# Patient Record
Sex: Male | Born: 1950
Health system: Southern US, Community
[De-identification: ages and names within clinical notes are randomized; demographics above are authoritative.]

## PROBLEM LIST (undated history)

## (undated) DIAGNOSIS — C801 Malignant (primary) neoplasm, unspecified: Secondary | ICD-10-CM

## (undated) DIAGNOSIS — D649 Anemia, unspecified: Secondary | ICD-10-CM

## (undated) DIAGNOSIS — Z87448 Personal history of other diseases of urinary system: Secondary | ICD-10-CM

## (undated) DIAGNOSIS — E119 Type 2 diabetes mellitus without complications: Secondary | ICD-10-CM

## (undated) DIAGNOSIS — K219 Gastro-esophageal reflux disease without esophagitis: Secondary | ICD-10-CM

## (undated) DIAGNOSIS — I10 Essential (primary) hypertension: Secondary | ICD-10-CM

## (undated) DIAGNOSIS — M1990 Unspecified osteoarthritis, unspecified site: Secondary | ICD-10-CM

## (undated) DIAGNOSIS — M109 Gout, unspecified: Secondary | ICD-10-CM

## (undated) DIAGNOSIS — N289 Disorder of kidney and ureter, unspecified: Secondary | ICD-10-CM

## (undated) HISTORY — DX: Anemia, unspecified: D64.9

## (undated) HISTORY — DX: Personal history of other diseases of urinary system: Z87.448

## (undated) HISTORY — DX: Gout, unspecified: M10.9

## (undated) HISTORY — PX: UPPER GI ENDOSCOPY: SHX6162

## (undated) HISTORY — DX: Type 2 diabetes mellitus without complications: E11.9

## (undated) HISTORY — PX: APPENDECTOMY: SHX54

## (undated) HISTORY — DX: Essential (primary) hypertension: I10

## (undated) HISTORY — DX: Gastro-esophageal reflux disease without esophagitis: K21.9

---

## 2006-08-07 ENCOUNTER — Ambulatory Visit: Payer: Self-pay | Admitting: Urology

## 2011-07-10 ENCOUNTER — Ambulatory Visit: Payer: Self-pay | Admitting: Internal Medicine

## 2013-03-26 DIAGNOSIS — I10 Essential (primary) hypertension: Secondary | ICD-10-CM

## 2013-03-26 DIAGNOSIS — E119 Type 2 diabetes mellitus without complications: Secondary | ICD-10-CM | POA: Insufficient documentation

## 2013-03-26 DIAGNOSIS — R066 Hiccough: Secondary | ICD-10-CM | POA: Insufficient documentation

## 2013-03-26 DIAGNOSIS — K219 Gastro-esophageal reflux disease without esophagitis: Secondary | ICD-10-CM

## 2013-03-26 HISTORY — DX: Essential (primary) hypertension: I10

## 2013-03-26 HISTORY — DX: Gastro-esophageal reflux disease without esophagitis: K21.9

## 2013-03-26 HISTORY — DX: Type 2 diabetes mellitus without complications: E11.9

## 2015-09-01 DIAGNOSIS — R7309 Other abnormal glucose: Secondary | ICD-10-CM | POA: Diagnosis not present

## 2015-09-01 DIAGNOSIS — R066 Hiccough: Secondary | ICD-10-CM | POA: Diagnosis not present

## 2015-09-01 DIAGNOSIS — E119 Type 2 diabetes mellitus without complications: Secondary | ICD-10-CM | POA: Diagnosis not present

## 2015-11-25 DIAGNOSIS — I10 Essential (primary) hypertension: Secondary | ICD-10-CM | POA: Diagnosis not present

## 2015-11-25 DIAGNOSIS — K3189 Other diseases of stomach and duodenum: Secondary | ICD-10-CM | POA: Diagnosis not present

## 2015-11-25 DIAGNOSIS — Z7984 Long term (current) use of oral hypoglycemic drugs: Secondary | ICD-10-CM | POA: Diagnosis not present

## 2015-11-25 DIAGNOSIS — Z881 Allergy status to other antibiotic agents status: Secondary | ICD-10-CM | POA: Diagnosis not present

## 2015-11-25 DIAGNOSIS — Z79899 Other long term (current) drug therapy: Secondary | ICD-10-CM | POA: Diagnosis not present

## 2015-11-25 DIAGNOSIS — K219 Gastro-esophageal reflux disease without esophagitis: Secondary | ICD-10-CM | POA: Diagnosis not present

## 2015-11-25 DIAGNOSIS — Z7982 Long term (current) use of aspirin: Secondary | ICD-10-CM | POA: Diagnosis not present

## 2015-11-25 DIAGNOSIS — M4682 Other specified inflammatory spondylopathies, cervical region: Secondary | ICD-10-CM | POA: Diagnosis not present

## 2015-11-25 DIAGNOSIS — E785 Hyperlipidemia, unspecified: Secondary | ICD-10-CM | POA: Diagnosis not present

## 2015-11-25 DIAGNOSIS — K2950 Unspecified chronic gastritis without bleeding: Secondary | ICD-10-CM | POA: Diagnosis not present

## 2015-11-25 DIAGNOSIS — K449 Diaphragmatic hernia without obstruction or gangrene: Secondary | ICD-10-CM | POA: Diagnosis not present

## 2015-11-25 DIAGNOSIS — E119 Type 2 diabetes mellitus without complications: Secondary | ICD-10-CM | POA: Diagnosis not present

## 2015-11-25 DIAGNOSIS — Z23 Encounter for immunization: Secondary | ICD-10-CM | POA: Diagnosis not present

## 2016-01-13 DIAGNOSIS — M19011 Primary osteoarthritis, right shoulder: Secondary | ICD-10-CM | POA: Diagnosis not present

## 2016-05-12 DIAGNOSIS — Z87448 Personal history of other diseases of urinary system: Secondary | ICD-10-CM | POA: Diagnosis not present

## 2016-05-12 DIAGNOSIS — E119 Type 2 diabetes mellitus without complications: Secondary | ICD-10-CM | POA: Diagnosis not present

## 2016-05-12 DIAGNOSIS — Z1159 Encounter for screening for other viral diseases: Secondary | ICD-10-CM | POA: Diagnosis not present

## 2016-06-13 ENCOUNTER — Encounter: Payer: Self-pay | Admitting: Urology

## 2016-06-13 ENCOUNTER — Ambulatory Visit: Payer: Medicare HMO | Admitting: Urology

## 2016-06-13 VITALS — BP 148/75 | HR 83 | Ht 63.0 in | Wt 116.0 lb

## 2016-06-13 DIAGNOSIS — R3129 Other microscopic hematuria: Secondary | ICD-10-CM | POA: Diagnosis not present

## 2016-06-13 NOTE — Progress Notes (Signed)
06/13/2016 11:03 AM   Ricky Jarvis [birth year] [id number]  Referring provider: Theotis Burrow, MD [address] [place], [place] [address]  Chief Complaint  Patient presents with  . Hematuria    New Patient    HPI: 66 year old male seen today for microscopic hematuria. The patient has been seen and evaluated for this in 2008, evidently this workup was unremarkable. The patient states that he does get up to 3 times a night, this is normal for him. He describes a strong stream, has no history of recurrent urinary tract infections or prostate infections. He has no history of kidney stones. He is no smoking history. The patient feels that he empties his bladder fairly well. The patient has not had a PSA recently.     PMH: Past Medical History:  Diagnosis Date  . Diabetes ([place]) 03/26/2013  . GERD (gastroesophageal reflux disease) 03/26/2013  . Gout   . History of hematuria   . HTN (hypertension) 03/26/2013    Surgical History: Past Surgical History:  Procedure Laterality Date  . APPENDECTOMY      Home Medications:  Allergies as of 06/13/2016      Reactions   Azithromycin    Other reaction(s): Other (See Comments)  Pt states that he has hiccups when he takes this medication      Medication List       Accurate as of 06/13/16 11:03 AM. Always use your most recent med list.          amLODipine 5 MG tablet Commonly known as:  NORVASC   aspirin 81 MG chewable tablet Chew 81 mg by mouth.   benazepril 20 MG tablet Commonly known as:  LOTENSIN   lovastatin 20 MG tablet Commonly known as:  MEVACOR   metFORMIN 500 MG tablet Commonly known as:  GLUCOPHAGE       Allergies:  Allergies  Allergen Reactions  . Azithromycin     Other reaction(s): Other (See Comments)  Pt states that he has hiccups when he takes this medication    Family History: Family History  Problem Relation Age of Onset  . Kidney cancer Mother   . Prostate cancer Neg Hx   .  Bladder Cancer Neg Hx     Social History:  reports that he has never smoked. He has never used smokeless tobacco. He reports that he does not drink alcohol or use drugs.  ROS: UROLOGY Frequent Urination?: No Hard to postpone urination?: No Burning/pain with urination?: No Get up at night to urinate?: No Leakage of urine?: No Urine stream starts and stops?: No Trouble starting stream?: No Do you have to strain to urinate?: No Blood in urine?: No Urinary tract infection?: No Sexually transmitted disease?: No Injury to kidneys or bladder?: No Painful intercourse?: No Weak stream?: No Erection problems?: No Penile pain?: No  Gastrointestinal Nausea?: No Vomiting?: No Indigestion/heartburn?: No Diarrhea?: No Constipation?: No  Constitutional Fever: No Night sweats?: No Weight loss?: No Fatigue?: No  Skin Skin rash/lesions?: No Itching?: No  Eyes Blurred vision?: No Double vision?: No  Ears/Nose/Throat Sore throat?: No Sinus problems?: No  Hematologic/Lymphatic Swollen glands?: No Easy bruising?: No  Cardiovascular Leg swelling?: No Chest pain?: No  Respiratory Cough?: No Shortness of breath?: No  Endocrine Excessive thirst?: No  Musculoskeletal Back pain?: No Joint pain?: No  Neurological Headaches?: No Dizziness?: No  Psychologic Depression?: No Anxiety?: No  Physical Exam: BP (!) 148/75   Pulse 83   Ht 5\' 3"  (1.6 m)  Wt 52.6 kg (116 lb)   BMI 20.55 kg/m   Constitutional:  Alert and oriented, No acute distress. HEENT: [place] AT, moist mucus membranes.  Trachea midline, no masses. Cardiovascular: No clubbing, cyanosis, or edema. Respiratory: Normal respiratory effort, no increased work of breathing. GI: Abdomen is soft, nontender, nondistended, no abdominal masses GU: No CVA tenderness DRE: Enlarged prostate, smooth, symmetric, nontender Skin: No rashes, bruises or suspicious lesions. Lymph: No cervical or inguinal  adenopathy. Neurologic: Grossly intact, no focal deficits, moving all 4 extremities. Psychiatric: Normal mood and affect.  Laboratory Data: No results found for: WBC, HGB, HCT, MCV, PLT  No results found for: CREATININE  No results found for: PSA  No results found for: TESTOSTERONE  No results found for: HGBA1C  Urinalysis No results found for: COLORURINE, APPEARANCEUR, LABSPEC, PHURINE, GLUCOSEU, HGBUR, BILIRUBINUR, KETONESUR, PROTEINUR, UROBILINOGEN, NITRITE, LEUKOCYTESUR  Pertinent Imaging: None  Assessment & Plan:  The patient has microscopic hematuria and he is otherwise asymptomatic. Our plan is to complete a hematuria evaluation including cystoscopy and a CT scan. A PSA and BUN/creatinine were drawn today. We will follow-up the results. Once the patient obtained a CT scan, he will return for cystoscopy.  1. Microscopic hematuria  - Urinalysis, Complete   No Follow-up on file.  Ardis Hughs, [facility] [address], [place], [place] [address] [phone]

## 2016-06-14 ENCOUNTER — Telehealth: Payer: Self-pay

## 2016-06-14 DIAGNOSIS — R972 Elevated prostate specific antigen [PSA]: Secondary | ICD-10-CM

## 2016-06-14 LAB — URINALYSIS, COMPLETE
BILIRUBIN UA: NEGATIVE
Glucose, UA: NEGATIVE
Ketones, UA: NEGATIVE
Leukocytes, UA: NEGATIVE
Nitrite, UA: NEGATIVE
Specific Gravity, UA: 1.025 (ref 1.005–1.030)
UUROB: 0.2 mg/dL (ref 0.2–1.0)
pH, UA: 6 (ref 5.0–7.5)

## 2016-06-14 LAB — BASIC METABOLIC PANEL
BUN/Creatinine Ratio: 21 (ref 10–24)
BUN: 15 mg/dL (ref 8–27)
CALCIUM: 9.3 mg/dL (ref 8.6–10.2)
CO2: 27 mmol/L (ref 18–29)
CREATININE: 0.73 mg/dL — AB (ref 0.76–1.27)
Chloride: 97 mmol/L (ref 96–106)
GFR, EST AFRICAN AMERICAN: 113 mL/min/{1.73_m2} (ref >59)
GFR, EST NON AFRICAN AMERICAN: 97 mL/min/{1.73_m2} (ref >59)
Glucose: 99 mg/dL (ref 65–99)
POTASSIUM: 4.1 mmol/L (ref 3.5–5.2)
Sodium: 138 mmol/L (ref 134–144)

## 2016-06-14 LAB — MICROSCOPIC EXAMINATION
Bacteria, UA: NONE SEEN
EPITHELIAL CELLS (NON RENAL): NONE SEEN /HPF (ref 0–10)
WBC, UA: NONE SEEN /hpf (ref 0–?)

## 2016-06-14 LAB — PSA, TOTAL AND FREE
PSA FREE PCT: 21.3 %
PSA FREE: 1.36 ng/mL
Prostate Specific Ag, Serum: 6.4 ng/mL — ABNORMAL HIGH (ref 0.0–4.0)

## 2016-06-14 LAB — TSH: TSH: 1.69 u[IU]/mL (ref 0.450–4.500)

## 2016-06-14 NOTE — Telephone Encounter (Signed)
Ardis Hughs, MD  Toniann Fail C, LPN        Please inform the patient that his PSA is to high and needs to be repeated. It should be repeated in 6 weeks. His TSH is normal.   Can you please help coordinate this?  Thank you,  BH    LMOM

## 2016-06-15 NOTE — Telephone Encounter (Signed)
Spoke with pt in reference to lab results. Made aware will need labs again in 6 weeks. Lab appt made and orders placed.

## 2016-06-30 ENCOUNTER — Ambulatory Visit
Admission: RE | Admit: 2016-06-30 | Discharge: 2016-06-30 | Disposition: A | Discharge status: Home / Self Care | Payer: Medicare HMO | Source: Ambulatory Visit | Attending: Urology | Admitting: Urology

## 2016-06-30 DIAGNOSIS — N400 Enlarged prostate without lower urinary tract symptoms: Secondary | ICD-10-CM | POA: Diagnosis not present

## 2016-06-30 DIAGNOSIS — R3129 Other microscopic hematuria: Secondary | ICD-10-CM | POA: Diagnosis present

## 2016-06-30 DIAGNOSIS — N289 Disorder of kidney and ureter, unspecified: Secondary | ICD-10-CM | POA: Diagnosis not present

## 2016-06-30 MED ORDER — IOPAMIDOL (ISOVUE-370) INJECTION 76%
100.0000 mL | Freq: Once | INTRAVENOUS | Status: AC | PRN
  Administered 2016-06-30: 100 mL via INTRAVENOUS

## 2016-07-03 DIAGNOSIS — R3129 Other microscopic hematuria: Secondary | ICD-10-CM | POA: Diagnosis not present

## 2016-07-04 ENCOUNTER — Other Ambulatory Visit: Payer: Medicare HMO

## 2016-07-25 ENCOUNTER — Ambulatory Visit (INDEPENDENT_AMBULATORY_CARE_PROVIDER_SITE_OTHER): Payer: Medicare HMO | Admitting: Urology

## 2016-07-25 ENCOUNTER — Encounter: Payer: Self-pay | Admitting: Urology

## 2016-07-25 VITALS — BP 134/78 | HR 91 | Ht 63.0 in | Wt 117.0 lb

## 2016-07-25 DIAGNOSIS — R3129 Other microscopic hematuria: Secondary | ICD-10-CM | POA: Diagnosis not present

## 2016-07-25 LAB — URINALYSIS, COMPLETE
Bilirubin, UA: NEGATIVE
Glucose, UA: NEGATIVE
Ketones, UA: NEGATIVE
Leukocytes, UA: NEGATIVE
NITRITE UA: NEGATIVE
PH UA: 5.5 (ref 5.0–7.5)
Specific Gravity, UA: 1.025 (ref 1.005–1.030)
Urobilinogen, Ur: 0.2 mg/dL (ref 0.2–1.0)

## 2016-07-25 LAB — MICROSCOPIC EXAMINATION
BACTERIA UA: NONE SEEN
EPITHELIAL CELLS (NON RENAL): NONE SEEN /HPF (ref 0–10)
RBC MICROSCOPIC, UA: NONE SEEN /HPF (ref 0–?)

## 2016-07-25 MED ORDER — CIPROFLOXACIN HCL 500 MG PO TABS
500.0000 mg | ORAL_TABLET | Freq: Once | ORAL | Status: AC
  Administered 2016-07-25: 500 mg via ORAL

## 2016-07-25 MED ORDER — LIDOCAINE HCL 2 % EX GEL
1.0000 "application " | Freq: Once | CUTANEOUS | Status: AC
  Administered 2016-07-25: 1 via URETHRAL

## 2016-07-25 NOTE — Progress Notes (Signed)
   07/25/16  CC:  Chief Complaint  Patient presents with  . Cysto    HPI:  Blood pressure 134/78, pulse 91, height 5\' 3"  (1.6 m), weight 53.1 kg (117 lb). NED. A&Ox3.   No respiratory distress   Abd soft, NT, ND Normal phallus with bilateral descended testicles  Cystoscopy Procedure Note  Patient identification was confirmed, informed consent was obtained, and patient was prepped using Betadine solution.  Lidocaine jelly was administered per urethral meatus.    Preoperative abx where received prior to procedure.     Pre-Procedure: - Inspection reveals a normal caliber ureteral meatus.  Procedure: The flexible cystoscope was introduced without difficulty - No urethral strictures/lesions are present. - The patient had a long prostatic urethra with severe lateral lobe hypertrophy and a moderate-sized median lobe. The bladder was otherwise normal. - Bilateral ureteral orifices identified - Bladder mucosa  reveals no ulcers, tumors, or lesions - No bladder stones - No trabeculation  Retroflexion shows minimal intravesical prostate.   Post-Procedure: - Patient tolerated the procedure well  Assessment/ Plan:   The etiology of the patient's hematuria is unclear, I don't think his renal masses are concerning, although this may well be the cause. However, there is nothing within the bladder or prostate to explain it.  The patient has bilateral renal masses, he will follow up with Dr. Erlene Quan for more definitive treatment. I did explain to him the findings on the CT scan, and briefly discussed surgical extirpation. However, he will need to discuss this in further detail at his follow-up appointment with Dr. Erlene Quan.

## 2016-07-27 ENCOUNTER — Ambulatory Visit: Payer: Medicare HMO | Admitting: Urology

## 2016-07-27 ENCOUNTER — Other Ambulatory Visit: Payer: Self-pay

## 2016-07-31 ENCOUNTER — Encounter: Payer: Self-pay | Admitting: Urology

## 2016-07-31 ENCOUNTER — Ambulatory Visit (INDEPENDENT_AMBULATORY_CARE_PROVIDER_SITE_OTHER): Payer: Medicare HMO | Admitting: Urology

## 2016-07-31 VITALS — BP 156/77 | HR 97 | Ht 61.0 in | Wt 117.0 lb

## 2016-07-31 DIAGNOSIS — R3129 Other microscopic hematuria: Secondary | ICD-10-CM | POA: Diagnosis not present

## 2016-07-31 DIAGNOSIS — N2889 Other specified disorders of kidney and ureter: Secondary | ICD-10-CM

## 2016-07-31 NOTE — Progress Notes (Signed)
07/31/2016 10:22 AM   Ricky Jarvis 02/14/1950 [id number]  Referring provider: Theotis Burrow, MD [address] [place], [place] [id number]  CC: renal mass  HPI: 66 year old male who presents here for further evaluation of bilateral renal masses. He underwent a microscopic hematuria workup including CT urogram on 07/03/2016 as well as cystoscopy by Dr. Louis Meckel. No obvious explanations for hematuria were found other than bilateral renal masses.  CT urogram from 06/30/2016 revealed a solid enhancing nodule measuring 2 cm arising from the anterior medial right kidney adjacent but not involving the collecting system. This previously measured 1.3 cm.  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  No lymphadenopathy or venous involvement bilaterally.  He is a previous microscopic hematuria workup back in 2008. At that time, in retrospect the CT scan did show a 1.3 cm right renal lesion near the hilum. This was not dictated in report.   Previous abdominal surgery includes open appendectomy many years ago. No other abdominal surgeries.  He denies any weight loss or gross hematuria. No flank pain.  He does take an 81 mg of aspirin as prevention has no history of cardiovascular issues.   PMH: Past Medical History:  Diagnosis Date  . Diabetes ([place]) 03/26/2013  . GERD (gastroesophageal reflux disease) 03/26/2013  . Gout   . History of hematuria   . HTN (hypertension) 03/26/2013    Surgical History: Past Surgical History:  Procedure Laterality Date  . APPENDECTOMY      Home Medications:  Allergies as of 07/31/2016      Reactions   Azithromycin    Other reaction(s): Other (See Comments)  Pt states that he has hiccups when he takes this medication      Medication List       Accurate as of 07/31/16 11:59 PM. Always use your most recent med list.          amLODipine 5 MG tablet Commonly known as:  NORVASC   aspirin 81 MG chewable tablet Chew 81 mg by mouth.   benazepril 20 MG tablet Commonly known as:  LOTENSIN   lovastatin 20 MG tablet Commonly known as:  MEVACOR   metFORMIN 500 MG tablet Commonly known as:  GLUCOPHAGE       Allergies:  Allergies  Allergen Reactions  . Azithromycin     Other reaction(s): Other (See Comments)  Pt states that he has hiccups when he takes this medication    Family History: Family History  Problem Relation Age of Onset  . Kidney cancer Mother   . Prostate cancer Neg Hx   . Bladder Cancer Neg Hx     Social History:  reports that he has never smoked. He has never used smokeless tobacco. He reports that he does not drink alcohol or use drugs.  ROS: 12 point review of systems was performed and is negative other than as per history of present illness. He has no urinary issues specifically.  Physical Exam: BP (!) 156/77   Pulse 97   Ht 5\' 1"  (1.549 m)   Wt 117 lb (53.1 kg)   BMI 22.11 kg/m   Constitutional:  Alert and oriented, No acute distress.  Accompanied by wife and son today as well as Optometrist. HEENT: [place] AT, moist mucus membranes.  Trachea midline, no masses. Cardiovascular: No clubbing, cyanosis, or edema. Respiratory: Normal respiratory effort, no increased  work of breathing. GI: Abdomen is soft, nontender, nondistended, no abdominal masses.  Right lower abdominal incision barely be visualized. GU: No CVA tenderness.  Skin: No rashes, bruises or suspicious lesions.. Neurologic: Grossly intact, no focal deficits, moving all 4 extremities. Psychiatric: Normal mood and affect.  Laboratory Data:  Lab Results  Component Value Date   CREATININE 0.73 (L) 06/13/2016     Urinalysis    Component Value Date/Time   APPEARANCEUR Clear 07/25/2016 0843   GLUCOSEU Negative 07/25/2016 0843   BILIRUBINUR Negative 07/25/2016 0843   PROTEINUR 2+ (A) 07/25/2016 0843    NITRITE Negative 07/25/2016 0843   LEUKOCYTESUR Negative 07/25/2016 0843    Pertinent Imaging: CLINICAL DATA:  Microscopic hematuria  EXAM: CT ABDOMEN AND PELVIS WITHOUT AND WITH CONTRAST  TECHNIQUE: Multidetector CT imaging of the abdomen and pelvis was performed following the standard protocol before and following the bolus administration of intravenous contrast.  CONTRAST:  100 cc of Isovue 370  COMPARISON:  08/06/2016  FINDINGS: Lower chest: No acute abnormality.  Hepatobiliary: No focal liver abnormality is seen. No gallstones, gallbladder wall thickening, or biliary dilatation.  Pancreas: Unremarkable. No pancreatic ductal dilatation or surrounding inflammatory changes.  Spleen: Normal in size without focal abnormality.  Adrenals/Urinary Tract: Normal adrenal glands. There is a solid enhancing nodule measuring 2 cm arising from the anteromedial cortex of the right mid kidney, image 24 of series 2. This has increased in size from 2008 when it measured 1.3 cm. A second lesion arises from the inferior cortex of the left kidney measures 2.1 cm, image 34 of series 11. This has cystic and solid components. New from previous exam. Urinary bladder appears normal.  Stomach/Bowel: Stomach is within normal limits. No evidence of bowel wall thickening, distention, or inflammatory changes.  Vascular/Lymphatic: Aortic atherosclerosis. No aneurysm. There is partially occluding calcified and noncalcified plaque involving the right common iliac artery, image 42 of series 7.  Reproductive: Prostate gland appears enlarged.  Other: No abdominal wall hernia or abnormality. No abdominopelvic ascites.  Musculoskeletal: Degenerative disc disease is identified within the lumbar spine.  IMPRESSION: 1. No acute findings within the abdomen or pelvis. 2. Solid enhancing lesion arising from the anteromedial cortex of the right mid kidney is identified and worrisome for  a small renal cell carcinoma. A second lesion arising from the inferior pole of the left kidney is identified is worrisome for cystic renal cell carcinoma. No specific findings identified to suggest renal vein involvement, IVC involvement or upper abdominal adenopathy. 3. Prostate gland enlargement   Electronically Signed   By: Kerby Moors M.D.   On: 07/03/2016 11:42  CT scan personally reviewed today with the patient.  This is compared to previous scan from 2008 which shows   Assessment & Plan:    1. Left renal mass  2.1 cm left lower pole renal mass most consistent with Bosniak 3/4 lesion.  This lesion is new since his last CT scan in 2008.  A solid renal mass raises the suspicion of primary renal malignancy.  We discussed this in detail and in regards to the spectrum of renal masses which includes cysts (pure cysts are considered benign), solid masses and everything in between. The risk of metastasis increases as the size of solid renal mass increases. In general, it is believed that the risk of metastasis for renal masses less than 3-4 cm is small (up to approximately 5%) based mainly on large retrospective studies.  In some cases and especially in patients of older  age and multiple comorbidities a surveillance approach may be appropriate. The treatment of solid renal masses includes: surveillance, cryoablation (percutaneous and laparoscopic) in addition to partial and complete nephrectomy (each with option of laparoscopic, robotic and open depending on appropriateness). Furthermore, nephrectomy appears to be an independent risk factor for the development of chronic kidney disease suggesting that nephron sparing approaches should be implored whenever feasible. We reviewed these options in context of the patients current situation as well as the pros and cons of each.  For cystic renal masses, we reviewed the Bosniak classification and discussed that Bosniak 3 lesions harbor a 50%  chance of malignancy whereas Bosniak 4 cysts have a solid and 90-95% are malignant in nature.   After lengthy discussion today, he is most interested in left robotic partial nephrectomy which the lesion appears quite easily amenable to.  We discussed specific risks of the surgery in detail today including risk of infection, damage is running structures, urine leak, pseudoaneurysm, AVM formation, conversion to radical versus open procedure, need for blood transfusion, amongst other serious risks including stroke, heart attack, and death. Anesthetic is also discussed. All questions were answered in detail. A translator was present today during our discussion.   2. Right renal mass Slowly enlarging enhancing right renal mass,. The lesion has increased 7 mm over the past 10 years which is a very slow growth rate. Additionally, given the location of the mass, it would be quite difficult to biopsy, bleeding, or removed using a nephron sparing technique.   At this point in time, he is elected to continue to need to observe the lesion with occasional interval imaging to assess for acceleration and growth rate.   Very central location of the mass also brings to mind possible upper tract urothelial lesion, however, given that the lesion has been present for at least 10 years, that makes this dx much less likely.    3. Microscopic hematuria S/p CT Urogram and cystoscopy 06/2016  Schedule left robotic partial nephrectomy  Hollice Espy, MD  [facility] [address], [place], [place] [address] [phone]  I spent 40 min with this patient of which greater than 50% was spent in counseling and coordination of care with the patient. Case was discussed with Dr. Louis Meckel as well.

## 2016-08-07 ENCOUNTER — Other Ambulatory Visit: Payer: Self-pay | Admitting: Radiology

## 2016-08-07 DIAGNOSIS — N2889 Other specified disorders of kidney and ureter: Secondary | ICD-10-CM

## 2016-08-08 ENCOUNTER — Telehealth: Payer: Self-pay | Admitting: Radiology

## 2016-08-08 NOTE — Telephone Encounter (Signed)
Notified pt of partial nephrectomy scheduled with Dr Erlene Quan on 08/29/16, pre-admit testing appt on 08/15/16 @2 :00 & to call day prior to surgery for arrival time to [facility]. Advised pt to hold ASA 81mg  x 7 days prior to surgery. Questions answered to pt's satisfaction & pt voices understanding.

## 2016-08-15 ENCOUNTER — Encounter
Admission: RE | Admit: 2016-08-15 | Discharge: 2016-08-15 | Disposition: A | Discharge status: Home / Self Care | Payer: Medicare HMO | Source: Ambulatory Visit | Attending: Urology | Admitting: Urology

## 2016-08-15 DIAGNOSIS — Z01810 Encounter for preprocedural cardiovascular examination: Secondary | ICD-10-CM | POA: Diagnosis present

## 2016-08-15 DIAGNOSIS — R066 Hiccough: Secondary | ICD-10-CM | POA: Diagnosis not present

## 2016-08-15 DIAGNOSIS — Z01812 Encounter for preprocedural laboratory examination: Secondary | ICD-10-CM | POA: Diagnosis not present

## 2016-08-15 DIAGNOSIS — I517 Cardiomegaly: Secondary | ICD-10-CM | POA: Diagnosis not present

## 2016-08-15 DIAGNOSIS — I10 Essential (primary) hypertension: Secondary | ICD-10-CM | POA: Insufficient documentation

## 2016-08-15 DIAGNOSIS — E119 Type 2 diabetes mellitus without complications: Secondary | ICD-10-CM | POA: Insufficient documentation

## 2016-08-15 DIAGNOSIS — M109 Gout, unspecified: Secondary | ICD-10-CM | POA: Insufficient documentation

## 2016-08-15 DIAGNOSIS — K219 Gastro-esophageal reflux disease without esophagitis: Secondary | ICD-10-CM | POA: Diagnosis not present

## 2016-08-15 HISTORY — DX: Unspecified osteoarthritis, unspecified site: M19.90

## 2016-08-15 LAB — BASIC METABOLIC PANEL
Anion gap: 9 (ref 5–15)
BUN: 18 mg/dL (ref 6–20)
CHLORIDE: 99 mmol/L — AB (ref 101–111)
CO2: 28 mmol/L (ref 22–32)
CREATININE: 0.85 mg/dL (ref 0.61–1.24)
Calcium: 9.6 mg/dL (ref 8.9–10.3)
GFR calc non Af Amer: >60 mL/min (ref >60)
Glucose, Bld: 89 mg/dL (ref 65–99)
Potassium: 4.1 mmol/L (ref 3.5–5.1)
Sodium: 136 mmol/L (ref 135–145)

## 2016-08-15 LAB — URINALYSIS, ROUTINE W REFLEX MICROSCOPIC
Bacteria, UA: NONE SEEN
Bilirubin Urine: NEGATIVE
GLUCOSE, UA: 150 mg/dL — AB
HGB URINE DIPSTICK: NEGATIVE
Ketones, ur: NEGATIVE mg/dL
Leukocytes, UA: NEGATIVE
NITRITE: NEGATIVE
PH: 5 (ref 5.0–8.0)
Protein, ur: 100 mg/dL — AB
SPECIFIC GRAVITY, URINE: 1.02 (ref 1.005–1.030)

## 2016-08-15 LAB — CBC
HCT: 44.2 % (ref 40.0–52.0)
HEMOGLOBIN: 15 g/dL (ref 13.0–18.0)
MCH: 29.2 pg (ref 26.0–34.0)
MCHC: 34 g/dL (ref 32.0–36.0)
MCV: 86 fL (ref 80.0–100.0)
PLATELETS: 337 10*3/uL (ref 150–440)
RBC: 5.14 MIL/uL (ref 4.40–5.90)
RDW: 13 % (ref 11.5–14.5)
WBC: 9.1 10*3/uL (ref 3.8–10.6)

## 2016-08-15 LAB — APTT: aPTT: 29 seconds (ref 24–36)

## 2016-08-15 LAB — PROTIME-INR
INR: 0.98
Prothrombin Time: 13 seconds (ref 11.4–15.2)

## 2016-08-15 NOTE — Patient Instructions (Signed)
Your procedure is scheduled on: Tuesday 08/29/16 Report to [facility]. 2ND FLOOR MEDICAL MALL ENTRANCE. To find out your arrival time please call [phone] between 1PM - 3PM on Monday 08/28/16.  Remember: Instructions that are not followed completely may result in serious medical risk, up to and including death, or upon the discretion of your surgeon and anesthesiologist your surgery may need to be rescheduled.    __X__ 1. Do not eat food or drink liquids after midnight. No gum chewing or hard candies.     __X__ 2. No Alcohol for 24 hours before or after surgery.   ____ 3. Bring all medications with you on the day of surgery if instructed.    __X__ 4. Notify your doctor if there is any change in your medical condition     (cold, fever, infections).             __X___5. No smoking within 24 hours of your surgery.     Do not wear jewelry, make-up, hairpins, clips or nail polish.  Do not wear lotions, powders, or perfumes.   Do not shave 48 hours prior to surgery. Men may shave face and neck.  Do not bring valuables to the hospital.    [facility] is not responsible for any belongings or valuables.               Contacts, dentures or bridgework may not be worn into surgery.  Leave your suitcase in the car. After surgery it may be brought to your room.  For patients admitted to the hospital, discharge time is determined by your                treatment team.   Patients discharged the day of surgery will not be allowed to drive home.   Please read over the following fact sheets that you were given:   MRSA Information   __X__ Take these medicines the morning of surgery with A SIP OF WATER:    1. NONE  2.   3.   4.  5.  6.  ____ Fleet Enema (as directed)   __X__ Use CHG Soap as directed  ____ Use inhalers on the day of surgery  __X__ Stop metformin 2 days prior to surgery STOP JANUMET LAST DOSE Saturday 08/26/16   ____ Take 1/2 of usual insulin dose the night before  surgery and none on the morning of surgery.   __X__ Stop aspirin on AS TOLD BY dR bRANDON  __X__ Stop Anti-inflammatories such as Advil, Aleve, Ibuprofen, Motrin, Naproxen, Naprosyn, Goodies,powder, or aspirin products.  OK to take Tylenol.   ____ Stop supplements until after surgery.    ____ Bring C-Pap to the hospital.

## 2016-08-16 NOTE — Pre-Procedure Instructions (Addendum)
UA CALLED AND FAXED TO [facility] AT DR Cherrie Gauze. ALSO CALLED AND FAXED CLEARANCE /EKG TO [facility] AND DR A REVELO SPOKE WITH DESIREE

## 2016-08-17 LAB — URINE CULTURE: Culture: <10000 — AB

## 2016-08-17 NOTE — Pre-Procedure Instructions (Signed)
CALL FROM [place] AT PCP OFFICE. PCP OUT Lewanda Rife 08/28/16. CLINIC TO SEE IF ANOTHER PROVIDER WILL SEE AND DETERMINE IF CARDIAC CLEARANCE NEEDED

## 2016-08-18 ENCOUNTER — Other Ambulatory Visit: Payer: Medicare HMO

## 2016-08-21 DIAGNOSIS — Z01818 Encounter for other preprocedural examination: Secondary | ICD-10-CM | POA: Diagnosis not present

## 2016-08-21 DIAGNOSIS — R7309 Other abnormal glucose: Secondary | ICD-10-CM | POA: Diagnosis not present

## 2016-08-23 NOTE — Pre-Procedure Instructions (Signed)
Cleared medium risk by pcp 08/21/16

## 2016-08-25 DIAGNOSIS — Z01818 Encounter for other preprocedural examination: Secondary | ICD-10-CM | POA: Diagnosis not present

## 2016-08-28 MED ORDER — CEFAZOLIN SODIUM-DEXTROSE 1-4 GM/50ML-% IV SOLN
1.0000 g | INTRAVENOUS | Status: AC
  Administered 2016-08-29 (×2): 1 g via INTRAVENOUS

## 2016-08-29 ENCOUNTER — Inpatient Hospital Stay: Payer: Medicare HMO | Admitting: Anesthesiology

## 2016-08-29 ENCOUNTER — Encounter: Payer: Self-pay | Admitting: *Deleted

## 2016-08-29 ENCOUNTER — Inpatient Hospital Stay
Admission: RE | Admit: 2016-08-29 | Discharge: 2016-08-31 | DRG: 658 | Disposition: A | Discharge status: Home / Self Care | Payer: Medicare HMO | Source: Ambulatory Visit | Attending: Urology | Admitting: Urology

## 2016-08-29 ENCOUNTER — Encounter
Admission: RE | Disposition: A | Discharge status: Home / Self Care | Payer: Self-pay | Source: Ambulatory Visit | Attending: Urology

## 2016-08-29 DIAGNOSIS — E119 Type 2 diabetes mellitus without complications: Secondary | ICD-10-CM | POA: Diagnosis not present

## 2016-08-29 DIAGNOSIS — K219 Gastro-esophageal reflux disease without esophagitis: Secondary | ICD-10-CM | POA: Diagnosis present

## 2016-08-29 DIAGNOSIS — C642 Malignant neoplasm of left kidney, except renal pelvis: Secondary | ICD-10-CM | POA: Diagnosis not present

## 2016-08-29 DIAGNOSIS — C652 Malignant neoplasm of left renal pelvis: Secondary | ICD-10-CM | POA: Diagnosis not present

## 2016-08-29 DIAGNOSIS — I10 Essential (primary) hypertension: Secondary | ICD-10-CM | POA: Diagnosis present

## 2016-08-29 DIAGNOSIS — N2889 Other specified disorders of kidney and ureter: Secondary | ICD-10-CM | POA: Diagnosis present

## 2016-08-29 DIAGNOSIS — D4102 Neoplasm of uncertain behavior of left kidney: Secondary | ICD-10-CM | POA: Diagnosis not present

## 2016-08-29 HISTORY — PX: ROBOTIC ASSITED PARTIAL NEPHRECTOMY: SHX6087

## 2016-08-29 LAB — GLUCOSE, CAPILLARY
Glucose-Capillary: 126 mg/dL — ABNORMAL HIGH (ref 65–99)
Glucose-Capillary: 206 mg/dL — ABNORMAL HIGH (ref 65–99)
Glucose-Capillary: 195 mg/dL — ABNORMAL HIGH (ref 65–99)
Glucose-Capillary: 125 mg/dL — ABNORMAL HIGH (ref 65–99)

## 2016-08-29 LAB — ABO/RH: ABO/RH(D): AB POS

## 2016-08-29 LAB — PREPARE RBC (CROSSMATCH)

## 2016-08-29 SURGERY — ROBOTIC ASSITED PARTIAL NEPHRECTOMY
Anesthesia: General | Site: Abdomen | Laterality: Left | Wound class: Clean Contaminated

## 2016-08-29 MED ORDER — FENTANYL CITRATE (PF) 100 MCG/2ML IJ SOLN
INTRAMUSCULAR | Status: AC
  Filled 2016-08-29: qty 2

## 2016-08-29 MED ORDER — BENAZEPRIL HCL 20 MG PO TABS
20.0000 mg | ORAL_TABLET | Freq: Every day | ORAL | Status: DC
  Administered 2016-08-29 – 2016-08-30 (×2): 20 mg via ORAL
  Filled 2016-08-29 (×3): qty 1

## 2016-08-29 MED ORDER — BUPIVACAINE HCL 0.5 % IJ SOLN
INTRAMUSCULAR | Status: DC | PRN
  Administered 2016-08-29: 10 mL

## 2016-08-29 MED ORDER — DIPHENHYDRAMINE HCL 12.5 MG/5ML PO ELIX
12.5000 mg | ORAL_SOLUTION | Freq: Four times a day (QID) | ORAL | Status: DC | PRN
  Filled 2016-08-29: qty 5

## 2016-08-29 MED ORDER — MIDAZOLAM HCL 2 MG/2ML IJ SOLN
INTRAMUSCULAR | Status: DC | PRN
  Administered 2016-08-29: 2 mg via INTRAVENOUS

## 2016-08-29 MED ORDER — CEFAZOLIN SODIUM-DEXTROSE 1-4 GM/50ML-% IV SOLN
INTRAVENOUS | Status: AC
  Filled 2016-08-29: qty 50

## 2016-08-29 MED ORDER — SODIUM CHLORIDE 0.9 % IV SOLN
INTRAVENOUS | Status: DC
  Administered 2016-08-29 (×2): via INTRAVENOUS

## 2016-08-29 MED ORDER — OXYCODONE-ACETAMINOPHEN 5-325 MG PO TABS: 1.0000 | ORAL_TABLET | ORAL | 0 refills | Status: DC | PRN

## 2016-08-29 MED ORDER — DIPHENHYDRAMINE HCL 50 MG/ML IJ SOLN: 12.5000 mg | Freq: Four times a day (QID) | INTRAMUSCULAR | Status: DC | PRN

## 2016-08-29 MED ORDER — AMLODIPINE BESYLATE 5 MG PO TABS
5.0000 mg | ORAL_TABLET | Freq: Every day | ORAL | Status: DC
  Administered 2016-08-29 – 2016-08-30 (×2): 5 mg via ORAL
  Filled 2016-08-29 (×2): qty 1

## 2016-08-29 MED ORDER — ROCURONIUM BROMIDE 100 MG/10ML IV SOLN
INTRAVENOUS | Status: DC | PRN
  Administered 2016-08-29 (×4): 10 mg via INTRAVENOUS
  Administered 2016-08-29: 20 mg via INTRAVENOUS
  Administered 2016-08-29: 10 mg via INTRAVENOUS
  Administered 2016-08-29: 20 mg via INTRAVENOUS
  Administered 2016-08-29: 10 mg via INTRAVENOUS
  Administered 2016-08-29: 45 mg via INTRAVENOUS
  Administered 2016-08-29: 5 mg via INTRAVENOUS

## 2016-08-29 MED ORDER — MANNITOL 25 % IV SOLN
INTRAVENOUS | Status: DC | PRN
  Administered 2016-08-29 (×2): 12.5 g via INTRAVENOUS

## 2016-08-29 MED ORDER — INSULIN ASPART 100 UNIT/ML ~~LOC~~ SOLN: 0.0000 [IU] | Freq: Every day | SUBCUTANEOUS | Status: DC

## 2016-08-29 MED ORDER — FENTANYL CITRATE (PF) 250 MCG/5ML IJ SOLN
INTRAMUSCULAR | Status: AC
  Filled 2016-08-29: qty 5

## 2016-08-29 MED ORDER — ROCURONIUM BROMIDE 50 MG/5ML IV SOLN
INTRAVENOUS | Status: AC
  Filled 2016-08-29: qty 1

## 2016-08-29 MED ORDER — ONDANSETRON HCL 4 MG/2ML IJ SOLN: 4.0000 mg | Freq: Once | INTRAMUSCULAR | Status: DC | PRN

## 2016-08-29 MED ORDER — PROPOFOL 10 MG/ML IV BOLUS
INTRAVENOUS | Status: AC
  Filled 2016-08-29: qty 20

## 2016-08-29 MED ORDER — CEFAZOLIN SODIUM 1 G IJ SOLR
INTRAMUSCULAR | Status: AC
  Filled 2016-08-29: qty 10

## 2016-08-29 MED ORDER — BUPIVACAINE HCL (PF) 0.5 % IJ SOLN
INTRAMUSCULAR | Status: AC
Start: 2016-08-29 — End: 2016-08-29
  Filled 2016-08-29: qty 30

## 2016-08-29 MED ORDER — SODIUM CHLORIDE FLUSH 0.9 % IV SOLN
INTRAVENOUS | Status: AC
  Filled 2016-08-29: qty 10

## 2016-08-29 MED ORDER — MANNITOL 25 % IV SOLN
INTRAVENOUS | Status: AC
Start: 2016-08-29 — End: 2016-08-29
  Filled 2016-08-29: qty 100

## 2016-08-29 MED ORDER — OXYBUTYNIN CHLORIDE 5 MG PO TABS: 5.0000 mg | ORAL_TABLET | Freq: Three times a day (TID) | ORAL | Status: DC | PRN

## 2016-08-29 MED ORDER — FENTANYL CITRATE (PF) 100 MCG/2ML IJ SOLN
25.0000 ug | INTRAMUSCULAR | Status: DC | PRN
  Administered 2016-08-29 (×2): 25 ug via INTRAVENOUS

## 2016-08-29 MED ORDER — SUCCINYLCHOLINE CHLORIDE 20 MG/ML IJ SOLN
INTRAMUSCULAR | Status: AC
Start: 2016-08-29 — End: 2016-08-29
  Filled 2016-08-29: qty 1

## 2016-08-29 MED ORDER — DOCUSATE SODIUM 100 MG PO CAPS: 100.0000 mg | ORAL_CAPSULE | Freq: Two times a day (BID) | ORAL | 0 refills | Status: DC

## 2016-08-29 MED ORDER — INSULIN ASPART 100 UNIT/ML ~~LOC~~ SOLN
4.0000 [IU] | Freq: Three times a day (TID) | SUBCUTANEOUS | Status: DC
  Administered 2016-08-29 – 2016-08-31 (×3): 4 [IU] via SUBCUTANEOUS
  Filled 2016-08-29 (×2): qty 1

## 2016-08-29 MED ORDER — LABETALOL HCL 5 MG/ML IV SOLN
INTRAVENOUS | Status: DC | PRN
  Administered 2016-08-29: 10 mg via INTRAVENOUS

## 2016-08-29 MED ORDER — ONDANSETRON HCL 4 MG/2ML IJ SOLN: 4.0000 mg | INTRAMUSCULAR | Status: DC | PRN

## 2016-08-29 MED ORDER — ROCURONIUM BROMIDE 50 MG/5ML IV SOLN
INTRAVENOUS | Status: AC
Start: 2016-08-29 — End: 2016-08-29
  Filled 2016-08-29: qty 1

## 2016-08-29 MED ORDER — THROMBIN 5000 UNITS EX SOLR
CUTANEOUS | Status: AC
  Filled 2016-08-29: qty 5000

## 2016-08-29 MED ORDER — PHENYLEPHRINE HCL 10 MG/ML IJ SOLN
INTRAMUSCULAR | Status: DC | PRN
  Administered 2016-08-29: 40 ug via INTRAVENOUS
  Administered 2016-08-29 (×2): 100 ug via INTRAVENOUS

## 2016-08-29 MED ORDER — LIDOCAINE HCL (PF) 2 % IJ SOLN
INTRAMUSCULAR | Status: AC
  Filled 2016-08-29: qty 2

## 2016-08-29 MED ORDER — FAMOTIDINE 20 MG PO TABS
ORAL_TABLET | ORAL | Status: AC
  Administered 2016-08-29: 20 mg via ORAL
  Filled 2016-08-29: qty 1

## 2016-08-29 MED ORDER — SODIUM CHLORIDE 0.9 % IV SOLN
INTRAVENOUS | Status: DC
  Administered 2016-08-29 – 2016-08-31 (×5): via INTRAVENOUS

## 2016-08-29 MED ORDER — BELLADONNA ALKALOIDS-OPIUM 16.2-60 MG RE SUPP: 1.0000 | Freq: Four times a day (QID) | RECTAL | Status: DC | PRN

## 2016-08-29 MED ORDER — ONDANSETRON HCL 4 MG/2ML IJ SOLN
INTRAMUSCULAR | Status: DC | PRN
  Administered 2016-08-29: 4 mg via INTRAVENOUS

## 2016-08-29 MED ORDER — FENTANYL CITRATE (PF) 100 MCG/2ML IJ SOLN
INTRAMUSCULAR | Status: DC | PRN
  Administered 2016-08-29: 50 ug via INTRAVENOUS
  Administered 2016-08-29: 100 ug via INTRAVENOUS
  Administered 2016-08-29 (×3): 50 ug via INTRAVENOUS

## 2016-08-29 MED ORDER — ACETAMINOPHEN 10 MG/ML IV SOLN
INTRAVENOUS | Status: AC
  Filled 2016-08-29: qty 100

## 2016-08-29 MED ORDER — PRAVASTATIN SODIUM 20 MG PO TABS
20.0000 mg | ORAL_TABLET | Freq: Every day | ORAL | Status: DC
  Administered 2016-08-29 – 2016-08-30 (×2): 20 mg via ORAL
  Filled 2016-08-29 (×2): qty 1

## 2016-08-29 MED ORDER — SUGAMMADEX SODIUM 200 MG/2ML IV SOLN
INTRAVENOUS | Status: AC
  Filled 2016-08-29: qty 2

## 2016-08-29 MED ORDER — ONDANSETRON HCL 4 MG/2ML IJ SOLN
INTRAMUSCULAR | Status: AC
  Filled 2016-08-29: qty 2

## 2016-08-29 MED ORDER — LIDOCAINE HCL (CARDIAC) 20 MG/ML IV SOLN
INTRAVENOUS | Status: DC | PRN
  Administered 2016-08-29: 50 mg via INTRAVENOUS

## 2016-08-29 MED ORDER — ACETAMINOPHEN 10 MG/ML IV SOLN
INTRAVENOUS | Status: DC | PRN
  Administered 2016-08-29: 1000 mg via INTRAVENOUS

## 2016-08-29 MED ORDER — MORPHINE SULFATE (PF) 2 MG/ML IV SOLN: 2.0000 mg | INTRAVENOUS | Status: DC | PRN

## 2016-08-29 MED ORDER — CEFAZOLIN SODIUM-DEXTROSE 1-4 GM/50ML-% IV SOLN
1.0000 g | Freq: Three times a day (TID) | INTRAVENOUS | Status: AC
  Administered 2016-08-29 – 2016-08-30 (×2): 1 g via INTRAVENOUS
  Filled 2016-08-29 (×2): qty 50

## 2016-08-29 MED ORDER — SUCCINYLCHOLINE CHLORIDE 20 MG/ML IJ SOLN
INTRAMUSCULAR | Status: DC | PRN
  Administered 2016-08-29: 60 mg via INTRAVENOUS

## 2016-08-29 MED ORDER — ACETAMINOPHEN 325 MG PO TABS: 650.0000 mg | ORAL_TABLET | ORAL | Status: DC | PRN

## 2016-08-29 MED ORDER — SUGAMMADEX SODIUM 200 MG/2ML IV SOLN
INTRAVENOUS | Status: DC | PRN
  Administered 2016-08-29: 110 mg via INTRAVENOUS

## 2016-08-29 MED ORDER — THROMBIN 5000 UNITS EX SOLR
CUTANEOUS | Status: DC | PRN
Start: 2016-08-29 — End: 2016-08-29
  Administered 2016-08-29: 5000 [IU] via TOPICAL

## 2016-08-29 MED ORDER — OXYCODONE-ACETAMINOPHEN 5-325 MG PO TABS
1.0000 | ORAL_TABLET | ORAL | Status: DC | PRN
  Administered 2016-08-29 – 2016-08-31 (×6): 2 via ORAL
  Filled 2016-08-29 (×6): qty 2

## 2016-08-29 MED ORDER — EVICEL 5 ML EX KIT
PACK | CUTANEOUS | Status: AC
  Filled 2016-08-29: qty 1

## 2016-08-29 MED ORDER — SODIUM CHLORIDE 0.9 % IJ SOLN
INTRAMUSCULAR | Status: AC
  Filled 2016-08-29: qty 10

## 2016-08-29 MED ORDER — DOCUSATE SODIUM 100 MG PO CAPS
100.0000 mg | ORAL_CAPSULE | Freq: Two times a day (BID) | ORAL | Status: DC
  Administered 2016-08-29 – 2016-08-31 (×4): 100 mg via ORAL
  Filled 2016-08-29 (×4): qty 1

## 2016-08-29 MED ORDER — EVICEL 5 ML EX KIT
PACK | CUTANEOUS | Status: DC | PRN
  Administered 2016-08-29: 5 mL

## 2016-08-29 MED ORDER — FAMOTIDINE 20 MG PO TABS
20.0000 mg | ORAL_TABLET | Freq: Once | ORAL | Status: AC
  Administered 2016-08-29: 20 mg via ORAL

## 2016-08-29 MED ORDER — SODIUM CHLORIDE 0.9 % IV SOLN
INTRAVENOUS | Status: DC | PRN
  Administered 2016-08-29: 08:00:00 via INTRAVENOUS

## 2016-08-29 MED ORDER — PHENYLEPHRINE HCL 10 MG/ML IJ SOLN
INTRAMUSCULAR | Status: AC
  Filled 2016-08-29: qty 1

## 2016-08-29 MED ORDER — INSULIN ASPART 100 UNIT/ML ~~LOC~~ SOLN
0.0000 [IU] | Freq: Three times a day (TID) | SUBCUTANEOUS | Status: DC
  Administered 2016-08-29: 3 [IU] via SUBCUTANEOUS
  Administered 2016-08-30: 2 [IU] via SUBCUTANEOUS
  Administered 2016-08-30: 5 [IU] via SUBCUTANEOUS
  Administered 2016-08-31: 3 [IU] via SUBCUTANEOUS
  Filled 2016-08-29 (×4): qty 1

## 2016-08-29 MED ORDER — PROPOFOL 10 MG/ML IV BOLUS
INTRAVENOUS | Status: DC | PRN
Start: 2016-08-29 — End: 2016-08-29
  Administered 2016-08-29: 100 mg via INTRAVENOUS

## 2016-08-29 MED ORDER — MIDAZOLAM HCL 2 MG/2ML IJ SOLN
INTRAMUSCULAR | Status: AC
  Filled 2016-08-29: qty 2

## 2016-08-29 SURGICAL SUPPLY — 86 items
ANCHOR TIS RET SYS 235ML (MISCELLANEOUS) IMPLANT
APPLICATOR SURGIFLO ENDO (HEMOSTASIS) ×2 IMPLANT
BNDG COHESIVE 4X5 TAN STRL (GAUZE/BANDAGES/DRESSINGS) ×2 IMPLANT
BULB RESERV EVAC DRAIN JP 100C (MISCELLANEOUS) ×2 IMPLANT
CANISTER SUCT 1200ML W/VALVE (MISCELLANEOUS) ×2 IMPLANT
CHLORAPREP W/TINT 26ML (MISCELLANEOUS) ×2 IMPLANT
CLIP LIGATING HEM O LOK PURPLE (MISCELLANEOUS) ×8 IMPLANT
CLIP SUT LAPRA TY ABSORB (SUTURE) ×10 IMPLANT
CORD BIP STRL DISP 12FT (MISCELLANEOUS) ×2 IMPLANT
COVER TIP SHEARS 8 DVNC (MISCELLANEOUS) ×1 IMPLANT
COVER TIP SHEARS 8MM DA VINCI (MISCELLANEOUS) ×1
DEFOGGER SCOPE WARMER CLEARIFY (MISCELLANEOUS) ×4 IMPLANT
DERMABOND ADVANCED (GAUZE/BANDAGES/DRESSINGS) ×1
DERMABOND ADVANCED .7 DNX12 (GAUZE/BANDAGES/DRESSINGS) ×1 IMPLANT
DRAIN CHANNEL JP 19F (MISCELLANEOUS) ×2 IMPLANT
DRAPE SHEET LG 3/4 BI-LAMINATE (DRAPES) ×4 IMPLANT
DRAPE SURG 17X11 SM STRL (DRAPES) ×8 IMPLANT
DRIVER LRG NEEDLE DA VINCI (INSTRUMENTS) ×2
DRIVER NDLE LRG DVNC (INSTRUMENTS) ×2 IMPLANT
ELECT REM PT RETURN 9FT ADLT (ELECTROSURGICAL) ×2
ELECTRODE REM PT RTRN 9FT ADLT (ELECTROSURGICAL) ×1 IMPLANT
EVICEL AIRLESS SPRAY ACCES (MISCELLANEOUS) ×2 IMPLANT
GLOVE BIO SURGEON STRL SZ 6.5 (GLOVE) ×6 IMPLANT
GLOVE BIOGEL PI IND STRL 6.5 (GLOVE) ×2 IMPLANT
GLOVE BIOGEL PI INDICATOR 6.5 (GLOVE) ×2
GOWN STRL REUS W/ TWL LRG LVL3 (GOWN DISPOSABLE) ×6 IMPLANT
GOWN STRL REUS W/TWL LRG LVL3 (GOWN DISPOSABLE) ×6
GRASPER DBL FENESTRATED (INSTRUMENTS) ×1
GRASPER DBL FENESTRATED DVNC (INSTRUMENTS) ×1 IMPLANT
GRASPER SUT TROCAR 14GX15 (MISCELLANEOUS) ×2 IMPLANT
HEMOSTAT SURGICEL 2X14 (HEMOSTASIS) ×4 IMPLANT
IRRIGATION STRYKERFLOW (MISCELLANEOUS) ×1 IMPLANT
IRRIGATOR STRYKERFLOW (MISCELLANEOUS) ×2
IV LACTATED RINGERS 1000ML (IV SOLUTION) ×2 IMPLANT
IV NS 1000ML (IV SOLUTION) ×1
IV NS 1000ML BAXH (IV SOLUTION) ×1 IMPLANT
KIT ACCESSORY DA VINCI DISP (KITS) ×1
KIT ACCESSORY DVNC DISP (KITS) ×1 IMPLANT
KIT PINK PAD W/HEAD ARE REST (MISCELLANEOUS) ×2
KIT PINK PAD W/HEAD ARM REST (MISCELLANEOUS) ×1 IMPLANT
KIT RM TURNOVER STRD PROC AR (KITS) ×2 IMPLANT
LABEL OR SOLS (LABEL) ×2 IMPLANT
LOOP RED MAXI  1X406MM (MISCELLANEOUS) ×1
LOOP VESSEL MAXI 1X406 RED (MISCELLANEOUS) ×1 IMPLANT
NEEDLE HYPO 25X1 1.5 SAFETY (NEEDLE) ×2 IMPLANT
NEEDLE INSUFFLATION 14GA 120MM (NEEDLE) ×2 IMPLANT
NS IRRIG 500ML POUR BTL (IV SOLUTION) ×2 IMPLANT
PACK LAP CHOLECYSTECTOMY (MISCELLANEOUS) ×2 IMPLANT
PENCIL ELECTRO HAND CTR (MISCELLANEOUS) ×2 IMPLANT
PROBE ULTRASOUND PROART (MISCELLANEOUS) ×2 IMPLANT
PROGRASP ENDOWRIST DA VINCI (INSTRUMENTS) ×1
PROGRASP ENDOWRIST DVNC (INSTRUMENTS) ×1 IMPLANT
SLEEVE ENDOPATH XCEL 5M (ENDOMECHANICALS) IMPLANT
SOLUTION ELECTROLUBE (MISCELLANEOUS) ×2 IMPLANT
SPOGE SURGIFLO 8M (HEMOSTASIS) ×1
SPONGE LAP 4X18 5PK (MISCELLANEOUS) ×2 IMPLANT
SPONGE SURGIFLO 8M (HEMOSTASIS) ×1 IMPLANT
SPONGE VERSALON 4X4 4PLY (MISCELLANEOUS) IMPLANT
STAPLE RELOAD 2.5MM WHITE (STAPLE) ×6 IMPLANT
STAPLER SKIN PROX 35W (STAPLE) ×2 IMPLANT
STAPLER VASCULAR ECHELON 35 (CUTTER) ×2 IMPLANT
STRAP SAFETY BODY (MISCELLANEOUS) ×6 IMPLANT
SUT DVC VLOC 90 3-0 CV23 UNDY (SUTURE) ×4 IMPLANT
SUT ETHILON 3-0 FS-10 30 BLK (SUTURE)
SUT MNCRL AB 4-0 PS2 18 (SUTURE) ×4 IMPLANT
SUT PDS PLUS 0 (SUTURE)
SUT PDS PLUS AB 0 CT-2 (SUTURE) IMPLANT
SUT PROLENE 4 0 RB 1 (SUTURE) ×2
SUT PROLENE 4-0 RB1 .5 CRCL 36 (SUTURE) ×2 IMPLANT
SUT VIC AB 0 CT1 36 (SUTURE) IMPLANT
SUT VIC AB 2-0 SH 27 (SUTURE) ×9
SUT VIC AB 2-0 SH 27XBRD (SUTURE) ×9 IMPLANT
SUT VICRYL 0 AB UR-6 (SUTURE) IMPLANT
SUT VICRYL PLUS ABS 0 54 (SUTURE) ×2 IMPLANT
SUTURE EHLN 3-0 FS-10 30 BLK (SUTURE) IMPLANT
TAPE CLOTH 10X20 WHT NS LF (TAPE) ×2 IMPLANT
TAPE CLOTH 2X10 WHT NS LF (TAPE) ×2
TIP RIGID 35CM EVICEL (HEMOSTASIS) ×2 IMPLANT
TRAY FOLEY W/METER SILVER 16FR (SET/KITS/TRAYS/PACK) ×2 IMPLANT
TROCAR 12M 150ML BLUNT (TROCAR) ×2 IMPLANT
TROCAR DISP BLADELESS 8 DVNC (TROCAR) ×1 IMPLANT
TROCAR DISP BLADELESS 8MM (TROCAR) ×1
TROCAR ENDOPATH XCEL 12X100 BL (ENDOMECHANICALS) ×2 IMPLANT
TROCAR XCEL 12X100 BLDLESS (ENDOMECHANICALS) ×2 IMPLANT
TROCAR XCEL NON-BLD 5MMX100MML (ENDOMECHANICALS) ×2 IMPLANT
TUBING INSUFFLATOR HI FLOW (MISCELLANEOUS) ×2 IMPLANT

## 2016-08-29 NOTE — Op Note (Signed)
PREOPERATIVE DIAGNOSIS: Left renal mass    POSTOPERATIVE DIAGNOSIS: Left renal mass    OPERATION PERFORMED: 1. Robotic assisted laparoscopic left partial nephrectomy 2. Intraoperative ultrasound with interpretation.  SURGEON: Hollice Espy, MD.  Physician Assistant:  Link Snuffer, PA  FINDINGS: Hilar anatomy: 1 arteries. 1 veins. ~2 cm solid renal mass  Warm ischemia time: 34 min, poor clamp  SPECIMENS: 1. Left renal mass  BLOOD LOSS: 750 cc  ANESTHESIA: General.  COMPLICATIONS: None.  DRAINS: 16-French Foley catheter. 19 Round JP  INDICATIONS FOR OPERATION: Left renal mass  DESCRIPTION OF OPERATION: Informed consent was obtained. The patient was marked on the left side and then taken to the operating room, placed supine on the operating table. General anesthesia was provided. SCDs were provided for DVT prophylaxis and IV antibiotics for bacterial prophylaxis. Foley catheter was placed to drain the bladder. OG tube was placed by anesthesia. The patient was then positioned in right lateral decubitus position with the left flank elevated about 70 degrees. All pressure points were carefully padded. The table was flexed slightly. The left arm was placed in a padded support. The patient was secured to the table with soft chest, hip, and lower extremity straps. The patient was prepped and draped in usual sterile fashion. We had a time-out confirming the patient identification, the planned procedure, the surgical site, and all present were in agreement.  A Veress needle was placed just lateral to the left rectus belly at the level of the umbilicus. Aspiration, irrigation, and saline drop test confirmed good position. Opening pressure was less than 9 mmHg. The abdomen was insufflated to 15 mmHg. Following this, trocar sites were mapped out with two robotic trocars triangulating the expected location of the tumor, a 12 mm trocar between, at about the  midclavicular line, for the camera, 12 mm midline trocars and a 5 mm for the assistant, and an 85mm trocar in the left lower quadrant for the 4th arm.  A 12 mm camera port was placed and peritoneal cavity was surveyed.  Just adjacent to the short gastric vessels.  At the end of the case, this was reinspected. A figure-of-eight using 3-0 Vicryl suture was placed over the small serosal defect as a precaution. Remaining trocars were placed under laparoscopic visualization.  The robot was docked. I placed monopolar shears in the right arm, [place] bipolar in the left, and a double fenestrated grasper in the 4th arm.    Colonic adhesions were taken down. The white line of Toldt was incised and the right colon was reflected. The tail of Gerota's fascia was lifted up and the ureter and gonadal vein were identified. The gonadal vein was left medial and the ureter was lifted up. The psoas muscle was identified under the ureter and I followed this plane towards the renal hilum. The hilar vessels were identified and exposed Circumferentially.  Vessel loops are placed around the renal vein as well as the large adrenal vein. Most felt to be the renal artery was isolated exposure large enough to accommodate bulldogs 2 was created.    At this point, I incised Gerota's fascia and defatted the kidney to expose the mass. Of note, the patient had "sticky fat" which was severely adherent to the capsule.  Once the tumor was exposed, intraoperative ultrasound was performed. The  mass was imaged and measured, and then excision markings were made. 12.5 gm of mannitol was given, and then bulldog clamps were placed on the renal artery x 2. Vein was NOT  clamped. Warm ischemia time was clocked. The renal mass was sharply excised and immediately placed in a 10 mm endocatch bag.  At this point in time, hemostasis was quite poor. It did not appear that the arterial clamp was sufficient. There is 3 large bleeding vessels  which were controlled using Weck's as well astThe first layer of the renorrhaphy was closed with running V-Loc suture, anchored at each end with a Lapra-Ty.  Finally, adequate hemostasis was achieved. A total of 750 cc blood loss occurred at this point. He remained hemodynamically stable. A sliding clip renorrhaphy was performed to close the outer layer, using interrupted 2-0 vicryl suture, Weck clips, and Lapra-Ty's. The capsule edges were nicely approximated. The bulldog clamps were removed. Warm ischemia time was 34 minutes with the caveat that this likely did not represent true ischemia given the amount of bleeding. Additional 12.5 gm of mannitol was given. All needles were extracted. The kidney was well perfused and the renorrhaphy was hemostatic. Gerota's was reapproximated using a running 2-0 vicryl suture.     hemostatic products including a bolster packed around the hilum as well as Tisseel was applied. Surgical flow Tisseel was applied to the renal bed as well as additional Tisseel applied to the anterior gastric wall at the site of the figure-of-eight suture.   A 19 round JP drain was placed lateral to the kidney. I reduced the pneumoperitoneal pressure to 7 mmHg and confirmed hemostasis throughout the surgical field. Trocars were removed under direct vision. The tumors were extracted and sent for pathology. Margins were grossly clear for both lesions. At the 12 mm trocar sites, the 0 Vicryl sutures were used to close the fascia. All the wounds were copiously irrigated and then infiltrated with local anesthetic. The skin edges were re approximated with 4-0 Monocryl. Dermabond was applied.   All sponge, needle, and instrument counts were reported correct. The patient was awakened from anesthesia and transferred to recovery in stable condition. There were no complications and the patient tolerated the procedure well. Operative events were discussed with the patient's family.    Link Snuffer was necessary today for all portions of the procedure including patient positioning, port placement, initially passing, hilar clamping, exposure, suctioning, and finally closing.  Without his assistance, the procedure were noted possible.  Hollice Espy, MD

## 2016-08-29 NOTE — Anesthesia Post-op Follow-up Note (Cosign Needed)
Anesthesia QCDR form completed.        

## 2016-08-29 NOTE — H&P (View-Only) (Signed)
07/31/2016 10:22 AM   Ricky Jarvis 1951-01-15 [id number]  Referring provider: Theotis Burrow, MD [address] [place], [place] [address]  CC: renal mass  HPI: 66 year old male who presents here for further evaluation of bilateral renal masses. He underwent a microscopic hematuria workup including CT urogram on 07/03/2016 as well as cystoscopy by Dr. Louis Meckel. No obvious explanations for hematuria were found other than bilateral renal masses.  CT urogram from 06/30/2016 revealed a solid enhancing nodule measuring 2 cm arising from the anterior medial right kidney adjacent but not involving the collecting system. This previously measured 1.3 cm.  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  No lymphadenopathy or venous involvement bilaterally.  He is a previous microscopic hematuria workup back in 2008. At that time, in retrospect the CT scan did show a 1.3 cm right renal lesion near the hilum. This was not dictated in report.   Previous abdominal surgery includes open appendectomy many years ago. No other abdominal surgeries.  He denies any weight loss or gross hematuria. No flank pain.  He does take an 81 mg of aspirin as prevention has no history of cardiovascular issues.   PMH: Past Medical History:  Diagnosis Date  . Diabetes ([place]) 03/26/2013  . GERD (gastroesophageal reflux disease) 03/26/2013  . Gout   . History of hematuria   . HTN (hypertension) 03/26/2013    Surgical History: Past Surgical History:  Procedure Laterality Date  . APPENDECTOMY      Home Medications:  Allergies as of 07/31/2016      Reactions   Azithromycin    Other reaction(s): Other (See Comments)  Pt states that he has hiccups when he takes this medication      Medication List       Accurate as of 07/31/16 11:59 PM. Always use your most recent med list.          amLODipine 5 MG tablet Commonly known as:  NORVASC   aspirin 81 MG chewable tablet Chew 81 mg by mouth.   benazepril 20 MG tablet Commonly known as:  LOTENSIN   lovastatin 20 MG tablet Commonly known as:  MEVACOR   metFORMIN 500 MG tablet Commonly known as:  GLUCOPHAGE       Allergies:  Allergies  Allergen Reactions  . Azithromycin     Other reaction(s): Other (See Comments)  Pt states that he has hiccups when he takes this medication    Family History: Family History  Problem Relation Age of Onset  . Kidney cancer Mother   . Prostate cancer Neg Hx   . Bladder Cancer Neg Hx     Social History:  reports that he has never smoked. He has never used smokeless tobacco. He reports that he does not drink alcohol or use drugs.  ROS: 12 point review of systems was performed and is negative other than as per history of present illness. He has no urinary issues specifically.  Physical Exam: BP (!) 156/77   Pulse 97   Ht 5\' 1"  (1.549 m)   Wt 117 lb (53.1 kg)   BMI 22.11 kg/m   Constitutional:  Alert and oriented, No acute distress.  Accompanied by wife and son today as well as Optometrist. HEENT:  AT, moist mucus membranes.  Trachea midline, no masses. Cardiovascular: No clubbing, cyanosis, or edema. Respiratory: Normal respiratory effort, no increased  work of breathing. GI: Abdomen is soft, nontender, nondistended, no abdominal masses.  Right lower abdominal incision barely be visualized. GU: No CVA tenderness.  Skin: No rashes, bruises or suspicious lesions.. Neurologic: Grossly intact, no focal deficits, moving all 4 extremities. Psychiatric: Normal mood and affect.  Laboratory Data:  Lab Results  Component Value Date   CREATININE 0.73 (L) 06/13/2016     Urinalysis    Component Value Date/Time   APPEARANCEUR Clear 07/25/2016 0843   GLUCOSEU Negative 07/25/2016 0843   BILIRUBINUR Negative 07/25/2016 0843   PROTEINUR 2+ (A) 07/25/2016 0843    NITRITE Negative 07/25/2016 0843   LEUKOCYTESUR Negative 07/25/2016 0843    Pertinent Imaging: CLINICAL DATA:  Microscopic hematuria  EXAM: CT ABDOMEN AND PELVIS WITHOUT AND WITH CONTRAST  TECHNIQUE: Multidetector CT imaging of the abdomen and pelvis was performed following the standard protocol before and following the bolus administration of intravenous contrast.  CONTRAST:  100 cc of Isovue 370  COMPARISON:  08/06/2016  FINDINGS: Lower chest: No acute abnormality.  Hepatobiliary: No focal liver abnormality is seen. No gallstones, gallbladder wall thickening, or biliary dilatation.  Pancreas: Unremarkable. No pancreatic ductal dilatation or surrounding inflammatory changes.  Spleen: Normal in size without focal abnormality.  Adrenals/Urinary Tract: Normal adrenal glands. There is a solid enhancing nodule measuring 2 cm arising from the anteromedial cortex of the right mid kidney, image 24 of series 2. This has increased in size from 2008 when it measured 1.3 cm. A second lesion arises from the inferior cortex of the left kidney measures 2.1 cm, image 34 of series 11. This has cystic and solid components. New from previous exam. Urinary bladder appears normal.  Stomach/Bowel: Stomach is within normal limits. No evidence of bowel wall thickening, distention, or inflammatory changes.  Vascular/Lymphatic: Aortic atherosclerosis. No aneurysm. There is partially occluding calcified and noncalcified plaque involving the right common iliac artery, image 42 of series 7.  Reproductive: Prostate gland appears enlarged.  Other: No abdominal wall hernia or abnormality. No abdominopelvic ascites.  Musculoskeletal: Degenerative disc disease is identified within the lumbar spine.  IMPRESSION: 1. No acute findings within the abdomen or pelvis. 2. Solid enhancing lesion arising from the anteromedial cortex of the right mid kidney is identified and worrisome for  a small renal cell carcinoma. A second lesion arising from the inferior pole of the left kidney is identified is worrisome for cystic renal cell carcinoma. No specific findings identified to suggest renal vein involvement, IVC involvement or upper abdominal adenopathy. 3. Prostate gland enlargement   Electronically Signed   By: Kerby Moors M.D.   On: 07/03/2016 11:42  CT scan personally reviewed today with the patient.  This is compared to previous scan from 2008 which shows   Assessment & Plan:    1. Left renal mass  2.1 cm left lower pole renal mass most consistent with Bosniak 3/4 lesion.  This lesion is new since his last CT scan in 2008.  A solid renal mass raises the suspicion of primary renal malignancy.  We discussed this in detail and in regards to the spectrum of renal masses which includes cysts (pure cysts are considered benign), solid masses and everything in between. The risk of metastasis increases as the size of solid renal mass increases. In general, it is believed that the risk of metastasis for renal masses less than 3-4 cm is small (up to approximately 5%) based mainly on large retrospective studies.  In some cases and especially in patients of older  age and multiple comorbidities a surveillance approach may be appropriate. The treatment of solid renal masses includes: surveillance, cryoablation (percutaneous and laparoscopic) in addition to partial and complete nephrectomy (each with option of laparoscopic, robotic and open depending on appropriateness). Furthermore, nephrectomy appears to be an independent risk factor for the development of chronic kidney disease suggesting that nephron sparing approaches should be implored whenever feasible. We reviewed these options in context of the patients current situation as well as the pros and cons of each.  For cystic renal masses, we reviewed the Bosniak classification and discussed that Bosniak 3 lesions harbor a 50%  chance of malignancy whereas Bosniak 4 cysts have a solid and 90-95% are malignant in nature.   After lengthy discussion today, he is most interested in left robotic partial nephrectomy which the lesion appears quite easily amenable to.  We discussed specific risks of the surgery in detail today including risk of infection, damage is running structures, urine leak, pseudoaneurysm, AVM formation, conversion to radical versus open procedure, need for blood transfusion, amongst other serious risks including stroke, heart attack, and death. Anesthetic is also discussed. All questions were answered in detail. A translator was present today during our discussion.   2. Right renal mass Slowly enlarging enhancing right renal mass,. The lesion has increased 7 mm over the past 10 years which is a very slow growth rate. Additionally, given the location of the mass, it would be quite difficult to biopsy, bleeding, or removed using a nephron sparing technique.   At this point in time, he is elected to continue to need to observe the lesion with occasional interval imaging to assess for acceleration and growth rate.   Very central location of the mass also brings to mind possible upper tract urothelial lesion, however, given that the lesion has been present for at least 10 years, that makes this dx much less likely.    3. Microscopic hematuria S/p CT Urogram and cystoscopy 06/2016  Schedule left robotic partial nephrectomy  Hollice Espy, MD  [facility] [address] [place], [place] [address] [phone]  I spent 40 min with this patient of which greater than 50% was spent in counseling and coordination of care with the patient. Case was discussed with Dr. Louis Meckel as well.

## 2016-08-29 NOTE — Transfer of Care (Signed)
Immediate Anesthesia Transfer of Care Note  Patient: [name]  Procedure(s) Performed: Procedure(s): ROBOTIC ASSITED PARTIAL NEPHRECTOMY (Left)  Patient Location: PACU  Anesthesia Type:General  Level of Consciousness: sedated and responds to stimulation  Airway & Oxygen Therapy: Patient Spontanous Breathing and Patient connected to face mask oxygen  Post-op Assessment: Report given to RN and Post -op Vital signs reviewed and stable  Post vital signs: Reviewed and stable  Last Vitals:  Vitals:   08/29/16 0632 08/29/16 1418  BP: (!) 151/74 106/63  Pulse: 82 83  Resp: 20 (!) 9  Temp: 36.6 C     Last Pain:  Vitals:   08/29/16 0632  TempSrc: Oral      Patients Stated Pain Goal: 0 (72/53/66 4403)  Complications: No apparent anesthesia complications

## 2016-08-29 NOTE — Anesthesia Procedure Notes (Signed)
Procedure Name: Intubation Performed by: Lance Muss Pre-anesthesia Checklist: Patient identified, Patient being monitored, Timeout performed, Emergency Drugs available and Suction available Patient Re-evaluated:Patient Re-evaluated prior to induction Oxygen Delivery Method: Circle system utilized Preoxygenation: Pre-oxygenation with 100% oxygen Induction Type: IV induction Ventilation: Mask ventilation without difficulty and Oral airway inserted - appropriate to patient size Laryngoscope Size: Mac and 3 Grade View: Grade II Tube type: Oral Tube size: 7.5 mm Number of attempts: 1 Airway Equipment and Method: Stylet Placement Confirmation: ETT inserted through vocal cords under direct vision,  positive ETCO2 and breath sounds checked- equal and bilateral Secured at: 22 cm Tube secured with: Tape Dental Injury: Teeth and Oropharynx as per pre-operative assessment

## 2016-08-29 NOTE — Anesthesia Procedure Notes (Signed)
Arterial Line Insertion Start/End7/17/2018 7:56 AM, 08/29/2016 8:00 AM Performed by: Lance Muss, CRNA  Preanesthetic checklist: patient identified, IV checked, site marked, risks and benefits discussed, surgical consent, monitors and equipment checked, pre-op evaluation, timeout performed and anesthesia consent Right, radial was placed Catheter size: 20 G Hand hygiene performed  Allen's test indicative of satisfactory collateral circulation Attempts: 1 Following insertion, dressing applied. Patient tolerated the procedure well with no immediate complications.

## 2016-08-29 NOTE — Interval H&P Note (Signed)
History and Physical Interval Note:  08/29/2016 7:20 AM  Ricky Jarvis  has presented today for surgery, with the diagnosis of LEFT RENAL MASS  The various methods of treatment have been discussed with the patient and family. After consideration of risks, benefits and other options for treatment, the patient has consented to  Procedure(s): ROBOTIC ASSITED PARTIAL NEPHRECTOMY (Left) as a surgical intervention .  The patient's history has been reviewed, patient examined, no change in status, stable for surgery.  I have reviewed the patient's chart and labs.  Questions were answered to the patient's satisfaction.    RRR CTAB  Hollice Espy

## 2016-08-29 NOTE — Anesthesia Preprocedure Evaluation (Signed)
Anesthesia Evaluation  Patient identified by MRN, date of birth, ID band Patient awake    Reviewed: Allergy & Precautions, H&P , NPO status , Patient's Chart, lab work & pertinent test results, reviewed documented beta blocker date and time   Airway Mallampati: III  TM Distance: >3 FB Neck ROM: full    Dental  (+) Teeth Intact, Poor Dentition, Missing   Pulmonary neg pulmonary ROS,    Pulmonary exam normal        Cardiovascular hypertension, negative cardio ROS Normal cardiovascular exam Rhythm:regular Rate:Normal     Neuro/Psych negative neurological ROS  negative psych ROS   GI/Hepatic negative GI ROS, Neg liver ROS, GERD  Medicated,  Endo/Other  negative endocrine ROSdiabetes  Renal/GU negative Renal ROS  negative genitourinary   Musculoskeletal   Abdominal   Peds  Hematology negative hematology ROS (+)   Anesthesia Other Findings Past Medical History: No date: Arthritis 03/26/2013: Diabetes ([employer]) 03/26/2013: GERD (gastroesophageal reflux disease) No date: Gout No date: History of hematuria 03/26/2013: HTN (hypertension) Past Surgical History: No date: APPENDECTOMY No date: UPPER GI ENDOSCOPY BMI    Body Mass Index:  20.73 kg/m     Reproductive/Obstetrics negative OB ROS                             Anesthesia Physical Anesthesia Plan  ASA: II  Anesthesia Plan: General ETT   Post-op Pain Management:    Induction:   PONV Risk Score and Plan:   Airway Management Planned:   Additional Equipment:   Intra-op Plan:   Post-operative Plan:   Informed Consent: I have reviewed the patients History and Physical, chart, labs and discussed the procedure including the risks, benefits and alternatives for the proposed anesthesia with the patient or authorized representative who has indicated his/her understanding and acceptance.   Dental Advisory Given  Plan Discussed with:  CRNA  Anesthesia Plan Comments:         Anesthesia Quick Evaluation

## 2016-08-30 ENCOUNTER — Encounter: Payer: Self-pay | Admitting: Urology

## 2016-08-30 LAB — BPAM RBC
BLOOD PRODUCT EXPIRATION DATE: 201808102359
Blood Product Expiration Date: 201808102359
Blood Product Expiration Date: 201808142359
Blood Product Expiration Date: 201808142359
ISSUE DATE / TIME: 201807241241
ISSUE DATE / TIME: 201807241241
UNIT TYPE AND RH: 6200
UNIT TYPE AND RH: 6200
Unit Type and Rh: 6200
Unit Type and Rh: 6200

## 2016-08-30 LAB — CREATININE, FLUID (PLEURAL, PERITONEAL, JP DRAINAGE): Creat, Fluid: 0.7 mg/dL

## 2016-08-30 LAB — TYPE AND SCREEN
ABO/RH(D): AB POS
ANTIBODY SCREEN: NEGATIVE
UNIT DIVISION: 0
UNIT DIVISION: 0
UNIT DIVISION: 0
Unit division: 0

## 2016-08-30 LAB — CBC
HEMATOCRIT: 34.6 % — AB (ref 40.0–52.0)
Hemoglobin: 11.5 g/dL — ABNORMAL LOW (ref 13.0–18.0)
MCH: 28.7 pg (ref 26.0–34.0)
MCHC: 33.3 g/dL (ref 32.0–36.0)
MCV: 86.2 fL (ref 80.0–100.0)
PLATELETS: 265 10*3/uL (ref 150–440)
RBC: 4.02 MIL/uL — ABNORMAL LOW (ref 4.40–5.90)
RDW: 13.1 % (ref 11.5–14.5)
WBC: 11.7 10*3/uL — AB (ref 3.8–10.6)

## 2016-08-30 LAB — BASIC METABOLIC PANEL
ANION GAP: 5 (ref 5–15)
BUN: 11 mg/dL (ref 6–20)
CALCIUM: 7.6 mg/dL — AB (ref 8.9–10.3)
CO2: 25 mmol/L (ref 22–32)
CREATININE: 0.94 mg/dL (ref 0.61–1.24)
Chloride: 109 mmol/L (ref 101–111)
GFR calc non Af Amer: >60 mL/min (ref >60)
Glucose, Bld: 104 mg/dL — ABNORMAL HIGH (ref 65–99)
Potassium: 3.4 mmol/L — ABNORMAL LOW (ref 3.5–5.1)
SODIUM: 139 mmol/L (ref 135–145)

## 2016-08-30 LAB — GLUCOSE, CAPILLARY
GLUCOSE-CAPILLARY: 153 mg/dL — AB (ref 65–99)
GLUCOSE-CAPILLARY: 203 mg/dL — AB (ref 65–99)
GLUCOSE-CAPILLARY: 219 mg/dL — AB (ref 65–99)
GLUCOSE-CAPILLARY: 140 mg/dL — AB (ref 65–99)
GLUCOSE-CAPILLARY: 167 mg/dL — AB (ref 65–99)
Glucose-Capillary: 117 mg/dL — ABNORMAL HIGH (ref 65–99)

## 2016-08-30 LAB — PREPARE RBC (CROSSMATCH)

## 2016-08-30 MED ORDER — PREMIER PROTEIN SHAKE
11.0000 [oz_av] | Freq: Two times a day (BID) | ORAL | Status: DC
  Administered 2016-08-30 – 2016-08-31 (×2): 11 [oz_av] via ORAL

## 2016-08-30 MED ORDER — FAMOTIDINE 20 MG PO TABS
20.0000 mg | ORAL_TABLET | Freq: Two times a day (BID) | ORAL | Status: DC
  Administered 2016-08-30 – 2016-08-31 (×3): 20 mg via ORAL
  Filled 2016-08-30 (×3): qty 1

## 2016-08-30 NOTE — Progress Notes (Signed)
08/30/2016  0915  Called into pt's room as pt was diaphoretic and complained of weakness.  Vital signs stable, CBG was 155.  Called attending physician Dr. Erlene Quan, who suspected this was vasovagal response to removal of foley catheter.  Suggested recheck and notify with results.  At ~1030, vitals remained stable with CBG of 205 and diaphoresis beginning to abate.  Dr. Erlene Quan was notified and no further interventions ordered at this time.  Will continue to monitor and assess.  Dola Argyle, RN

## 2016-08-30 NOTE — Progress Notes (Signed)
Initial Nutrition Assessment  DOCUMENTATION CODES:   Non-severe (moderate) malnutrition in context of chronic illness  INTERVENTION:  1. Premier Protein BID, each supplement provides 160 calories and 30 grams of protein   NUTRITION DIAGNOSIS:   Malnutrition (Moderate) related to chronic illness as evidenced by moderate depletions of muscle mass, moderate depletion of body fat.  GOAL:   Patient will meet greater than or equal to 90% of their needs  MONITOR:   PO intake, Supplement acceptance, I & O's, Labs  REASON FOR ASSESSMENT:   Malnutrition Screening Tool    ASSESSMENT:   66 yo male with PMH of diabetes, GERD, HTN, presents with left renal mass, is now 1 day s/p robotic assisted partial nephrectomy  Discussed patient with RN and Patient Risk analyst (RD) Spoke with daughter and patient at bedside. He presents with poor PO intake for many months, 10# wt loss over the course of 1 year. They are unsure of the cause. According to daughter, patient had his diet tweaked by his brother who is a physician, and he has gained 2-3# recently.  Normally consumes pita bread, eggs for breakfast. Chicken, rice, vegetables, curry for lunch and dinner. He is muslim, only consumes Geneticist, molecular, requested fish and vegetarian food. Discussed with patient Risk analyst. Did not eat breakfast this morning because he received bacon on his eggs. JP drain to L abdomen Nutrition-Focused physical exam completed. Findings are moderate fat depletion, moderate muscle depletion, and no edema.  Labs reviewed:  CBGs 219, 153, 117, 125; K+ 3.4 Medications reviewed and include:  Colace Novolog 0-15 Units TID, 0-5 Units HS NS at 137mL/hr   Intake/Output Summary (Last 24 hours) at 08/30/16 1527 Last data filed at 08/30/16 1309  Gross per 24 hour  Intake          1652.75 ml  Output             4545 ml  Net         -2892.25 ml  726mL EBL, 256mL from JP Drain, 3457mL UOP last 24hrs   Diet  Order:  Diet Carb Modified Fluid consistency: Thin; Room service appropriate? Yes  Skin:  Wound (see comment) (closed incision to abdomen)  Last BM:  PTA  Height:   Ht Readings from Last 1 Encounters:  08/29/16 5\' 3"  (1.6 m)    Weight:   Wt Readings from Last 1 Encounters:  08/29/16 117 lb (53.1 kg)    Ideal Body Weight:  56.36 kg  BMI:  Body mass index is 20.73 kg/m.  Estimated Nutritional Needs:   Kcal:  1450-1600 calories (MSJ x1.2-1.3)  Protein:  64-80 grams (1.2-1.5g/kg)  Fluid:  1.5-1.7L  EDUCATION NEEDS:   Education needs addressed  Satira Anis. Uniqua Kihn, MS, RD LDN Inpatient Clinical Dietitian Pager [phone]

## 2016-08-30 NOTE — Progress Notes (Signed)
1 Day Post-Op Subjective: The patient is doing well.  No nausea or vomiting. Pain is adequately controlled.  Objective: Vital signs in last 24 hours: Temp:  [97.3 F (36.3 C)-98.8 F (37.1 C)] 98.7 F (37.1 C) (07/25 0443) Pulse Rate:  [77-99] 97 (07/25 0443) Resp:  [9-20] 18 (07/25 0443) BP: (101-123)/(54-67) 121/62 (07/25 0443) SpO2:  [94 %-100 %] 97 % (07/25 0443) Arterial Line BP: (143)/(55) 143/55 (07/24 1419)  Intake/Output from previous day: 07/24 0701 - 07/25 0700 In: 3078.8 [P.O.:360; I.V.:2668.8; IV Piggyback:50] Out: 3875 [Urine:3450; Drains:225; Blood:750] Intake/Output this shift: No intake/output data recorded.  Physical Exam:  General: Alert and oriented. CV: RRR Lungs: Clear bilaterally. GI: Soft, Nondistended. Incisions: Clean and dry.  JP with serosanguineous fluid. Urine: Clear, Foley in place.   Extremities: Nontender, no erythema, no edema.  Lab Results:  Recent Labs  08/30/16 0428  HGB 11.5*  HCT 34.6*           Recent Labs  08/30/16 0428  CREATININE 0.94           Results for orders placed or performed during the hospital encounter of 08/29/16 (from the past 24 hour(s))  Prepare RBC (crossmatch)     Status: None   Collection Time: 08/29/16 12:54 PM  Result Value Ref Range   Order Confirmation ORDER PROCESSED BY BLOOD BANK   Glucose, capillary     Status: Abnormal   Collection Time: 08/29/16  4:56 PM  Result Value Ref Range   Glucose-Capillary 206 (H) 65 - 99 mg/dL  Glucose, capillary     Status: Abnormal   Collection Time: 08/29/16  6:20 PM  Result Value Ref Range   Glucose-Capillary 195 (H) 65 - 99 mg/dL   Comment 1 Notify RN   Glucose, capillary     Status: Abnormal   Collection Time: 08/29/16 10:07 PM  Result Value Ref Range   Glucose-Capillary 125 (H) 65 - 99 mg/dL  CBC     Status: Abnormal   Collection Time: 08/30/16  4:28 AM  Result Value Ref Range   WBC 11.7 (H) 3.8 - 10.6 K/uL   RBC 4.02 (L) 4.40 - 5.90 MIL/uL   Hemoglobin 11.5 (L) 13.0 - 18.0 g/dL   HCT 34.6 (L) 40.0 - 52.0 %   MCV 86.2 80.0 - 100.0 fL   MCH 28.7 26.0 - 34.0 pg   MCHC 33.3 32.0 - 36.0 g/dL   RDW 13.1 11.5 - 14.5 %   Platelets 265 150 - 440 K/uL  Basic metabolic panel     Status: Abnormal   Collection Time: 08/30/16  4:28 AM  Result Value Ref Range   Sodium 139 135 - 145 mmol/L   Potassium 3.4 (L) 3.5 - 5.1 mmol/L   Chloride 109 101 - 111 mmol/L   CO2 25 22 - 32 mmol/L   Glucose, Bld 104 (H) 65 - 99 mg/dL   BUN 11 6 - 20 mg/dL   Creatinine, Ser 0.94 0.61 - 1.24 mg/dL   Calcium 7.6 (L) 8.9 - 10.3 mg/dL   GFR calc non Af Amer >60 >60 mL/min   GFR calc Af Amer >60 >60 mL/min   Anion gap 5 5 - 15  Glucose, capillary     Status: Abnormal   Collection Time: 08/30/16  7:35 AM  Result Value Ref Range   Glucose-Capillary 117 (H) 65 - 99 mg/dL    Assessment/Plan: POD# 1 s/p robotic partial nephrectomy.  1) Ambulate, Incentive spirometry 2) Advance diet as tolerated 3)  Transition to oral pain medication 4) D/C Foley 5) Drain Cr today   Hollice Espy, MD   LOS: 1 day   Hollice Espy 08/30/2016, 8:11 AM

## 2016-08-31 LAB — BASIC METABOLIC PANEL
ANION GAP: 5 (ref 5–15)
BUN: 10 mg/dL (ref 6–20)
CHLORIDE: 106 mmol/L (ref 101–111)
CO2: 27 mmol/L (ref 22–32)
Calcium: 8 mg/dL — ABNORMAL LOW (ref 8.9–10.3)
Creatinine, Ser: 0.71 mg/dL (ref 0.61–1.24)
GFR calc Af Amer: >60 mL/min (ref >60)
Glucose, Bld: 131 mg/dL — ABNORMAL HIGH (ref 65–99)
Potassium: 3.1 mmol/L — ABNORMAL LOW (ref 3.5–5.1)
SODIUM: 138 mmol/L (ref 135–145)

## 2016-08-31 LAB — HEMOGLOBIN AND HEMATOCRIT, BLOOD
HEMATOCRIT: 33.1 % — AB (ref 40.0–52.0)
Hemoglobin: 11.2 g/dL — ABNORMAL LOW (ref 13.0–18.0)

## 2016-08-31 LAB — SURGICAL PATHOLOGY

## 2016-08-31 LAB — GLUCOSE, CAPILLARY
GLUCOSE-CAPILLARY: 113 mg/dL — AB (ref 65–99)
Glucose-Capillary: 169 mg/dL — ABNORMAL HIGH (ref 65–99)

## 2016-08-31 MED ORDER — BISACODYL 10 MG RE SUPP
10.0000 mg | Freq: Once | RECTAL | Status: AC
  Administered 2016-08-31: 10 mg via RECTAL
  Filled 2016-08-31: qty 1

## 2016-08-31 MED ORDER — POTASSIUM CHLORIDE 20 MEQ PO PACK
40.0000 meq | PACK | Freq: Once | ORAL | Status: AC
  Administered 2016-08-31: 40 meq via ORAL
  Filled 2016-08-31: qty 2

## 2016-08-31 NOTE — Discharge Summary (Signed)
Date of admission: 08/29/2016  Date of discharge: 08/31/2016  Admission diagnosis: Left renal mass  Discharge diagnosis: Left renal mass  Secondary diagnoses:  Patient Active Problem List   Diagnosis Date Noted  . Left renal mass 08/29/2016  . Diabetes ([place]) 03/26/2013  . GERD (gastroesophageal reflux disease) 03/26/2013  . Hiccups 03/26/2013  . HTN (hypertension) 03/26/2013    History and Physical: For full details, please see admission history and physical. Briefly, Ricky Jarvis is a 66 y.o. year old patient with Left renal mass status post robotic partial nephrectomy admitted for observation following the procedure.   Hospital Course: Patient tolerated the procedure well.  He was then transferred to the floor after an uneventful PACU stay.  His hospital course was uncomplicated.  His Foley was removed on postop day 1.  On POD#2 he had met discharge criteria: was eating a regular diet, was up and ambulating independently,  pain was well controlled, was voiding without a catheter, and was ready to for discharge.  Physical Exam  Constitutional: He is oriented to person, place, and time. He appears well-developed and well-nourished.  HENT:  Head: Normocephalic and atraumatic.  Eyes: Pupils are equal, round, and reactive to light. EOM are normal.  Neck: Normal range of motion. Neck supple.  Cardiovascular: Normal rate.   Pulmonary/Chest: Effort normal. No respiratory distress.  Abdominal: Soft. Bowel sounds are normal. He exhibits no distension.  Wounds c/d/i.  JP with scant drainage.    Genitourinary: Penis normal.  Neurological: He is alert and oriented to person, place, and time.  Skin: Skin is warm and dry. Capillary refill takes less than 2 seconds.  Psychiatric: He has a normal mood and affect.  Vitals reviewed.    Laboratory values:   Recent Labs  08/30/16 0428 08/31/16 0819  WBC 11.7*  --   HGB 11.5* 11.2*  HCT 34.6* 33.1*    Recent Labs  08/30/16 0428  08/31/16 0819  NA 139 138  K 3.4* 3.1*  CL 109 106  CO2 25 27  GLUCOSE 104* 131*  BUN 11 10  CREATININE 0.94 0.71  CALCIUM 7.6* 8.0*   No results for input(s): LABPT, INR in the last 72 hours. No results for input(s): LABURIN in the last 72 hours. Results for orders placed or performed during the hospital encounter of 08/15/16  Urine culture     Status: Abnormal   Collection Time: 08/15/16  3:24 PM  Result Value Ref Range Status   Specimen Description URINE, CLEAN CATCH  Final   Special Requests NONE  Final   Culture (A)  Final    <10,000 COLONIES/mL INSIGNIFICANT GROWTH Performed at [facility], [address] [place], [place] [address]    Report Status 08/17/2016 FINAL  Final    Disposition: Home  Discharge instruction: The patient was instructed to be ambulatory but told to refrain from heavy lifting, strenuous activity, or driving While taking narcotics.  Discharge medications: Allergies as of 08/31/2016      Reactions   Azithromycin    Other reaction(s): Other (See Comments)  Pt states that he has hiccups when he takes this medication      Medication List    TAKE these medications   amLODipine 5 MG tablet Commonly known as:  NORVASC Take 5 mg by mouth at bedtime.   aspirin EC 81 MG tablet Take 81 mg by mouth 3 (three) times a week.   benazepril 20 MG tablet Commonly known as:  LOTENSIN Take 20 mg  by mouth at bedtime.   cholecalciferol 1000 units tablet Commonly known as:  VITAMIN D Take 1,000 Units by mouth daily.   docusate sodium 100 MG capsule Commonly known as:  COLACE Take 1 capsule (100 mg total) by mouth 2 (two) times daily.   lovastatin 20 MG tablet Commonly known as:  MEVACOR Take 20 mg by mouth at bedtime.   oxyCODONE-acetaminophen 5-325 MG tablet Commonly known as:  PERCOCET/ROXICET Take 1-2 tablets by mouth every 4 (four) hours as needed for moderate pain.   sitaGLIPtin-metformin 50-500 MG tablet Commonly known as:   JANUMET Take 1 tablet by mouth 2 (two) times daily with a meal.       Followup:  Follow-up Information    Hollice Espy, MD In 4 weeks.   Specialty:  Urology Why:  For wound re-check Contact information: [address] [place] [address] [phone]

## 2016-08-31 NOTE — Progress Notes (Signed)
Pt to be discharged per Md order. Iv removed. Instructions reviewed with pt and family. All questions answered. Scripts given to pt. Will discharge in wheelchair when pt is ready

## 2016-09-01 ENCOUNTER — Ambulatory Visit (INDEPENDENT_AMBULATORY_CARE_PROVIDER_SITE_OTHER): Payer: Medicare HMO

## 2016-09-01 ENCOUNTER — Other Ambulatory Visit: Payer: Self-pay | Admitting: Radiology

## 2016-09-01 VITALS — BP 146/81 | HR 97 | Ht 63.0 in | Wt 118.4 lb

## 2016-09-01 DIAGNOSIS — R350 Frequency of micturition: Secondary | ICD-10-CM | POA: Insufficient documentation

## 2016-09-01 LAB — URINALYSIS, COMPLETE
Bilirubin, UA: NEGATIVE
KETONES UA: NEGATIVE
Leukocytes, UA: NEGATIVE
NITRITE UA: NEGATIVE
SPEC GRAV UA: 1.01 (ref 1.005–1.030)
UUROB: 0.2 mg/dL (ref 0.2–1.0)
pH, UA: 6 (ref 5.0–7.5)

## 2016-09-01 LAB — MICROSCOPIC EXAMINATION

## 2016-09-01 LAB — BLADDER SCAN AMB NON-IMAGING: Scan Result: 0

## 2016-09-01 NOTE — Progress Notes (Signed)
Patient presents in office today with c/o UTI sx(s) x1 day:  Urinary frequency  No blood seen in urine No fevers/chills   Vitals:   09/01/16 1147  BP: (!) 146/81  Pulse: 97  Weight: 118 lb 6.4 oz (53.7 kg)  Height: 5\' 3"  (1.6 m)    Bladder Scan Patient can void: 0 ml Performed By: C. Corinna Capra, CMA  U/A negative. Advised pt frequency probably from fluids given in OR per Dr. Erlene Quan. F/u with Dr. Erlene Quan in 1 month. Patient agreed and verbalized understanding.

## 2016-09-01 NOTE — Anesthesia Postprocedure Evaluation (Signed)
Anesthesia Post Note  Patient: Ricky Jarvis  Procedure(s) Performed: Procedure(s) (LRB): ROBOTIC ASSITED PARTIAL NEPHRECTOMY (Left)  Patient location during evaluation: PACU Anesthesia Type: General Level of consciousness: awake and alert Pain management: pain level controlled Vital Signs Assessment: post-procedure vital signs reviewed and stable Respiratory status: spontaneous breathing, nonlabored ventilation, respiratory function stable and patient connected to nasal cannula oxygen Cardiovascular status: blood pressure returned to baseline and stable Postop Assessment: no signs of nausea or vomiting Anesthetic complications: no     Last Vitals:  Vitals:   08/31/16 0452 08/31/16 1229  BP: 122/65 (!) 119/59  Pulse: 95 79  Resp: 17   Temp: 36.9 C     Last Pain:  Vitals:   08/31/16 1459  TempSrc:   PainSc: 2                  Molli Barrows

## 2016-09-19 ENCOUNTER — Telehealth: Payer: Self-pay | Admitting: Urology

## 2016-09-19 DIAGNOSIS — R066 Hiccough: Secondary | ICD-10-CM | POA: Diagnosis not present

## 2016-09-19 NOTE — Telephone Encounter (Signed)
Pt came by and wanted a copy of his biopsy report.  I could barely understand him.  I explained to him since he hasn't been in for his post op follow up that I couldn't give out that report.  I think he understood.  Then he asked if it could be faxed to [facility] Dr. [facility]ck.  I told him not until after his appt.  Please call [phone] (pt's daughter)

## 2016-09-20 NOTE — Telephone Encounter (Signed)
Attempted to call pt daughter. Numbers provided are disconnected.

## 2016-09-29 ENCOUNTER — Encounter: Payer: Self-pay | Admitting: Urology

## 2016-09-29 ENCOUNTER — Ambulatory Visit (INDEPENDENT_AMBULATORY_CARE_PROVIDER_SITE_OTHER): Payer: Medicare HMO | Admitting: Urology

## 2016-09-29 VITALS — BP 144/78 | HR 98 | Ht 63.0 in | Wt 118.0 lb

## 2016-09-29 DIAGNOSIS — N2889 Other specified disorders of kidney and ureter: Secondary | ICD-10-CM | POA: Diagnosis not present

## 2016-09-29 NOTE — Progress Notes (Signed)
09/29/2016 3:59 PM   Ricky Jarvis September 02, 1950 [id number]  Referring provider: Theotis Burrow, MD [address] [place], [place] [address]  No chief complaint on file.   HPI:  66 year old male who initially presented with microscopic hematuria found to have small bilateral renal masses. He returns today following left robotic partial nephrectomy on 08/29/2016.    Surgical pathology was consistent with clear cell carcinoma, 1.8 cm, Furhman grade 2, margins negative.  Postop, he's had no issues. He has had issues with appetite both before and after surgery which is a chronic issue. No constipation. No urinary symptoms. Overall, he is feeling well.  CT urogram from 06/30/2016 revealed a solid enhancing nodule measuring 2 cm arising from the anterior medial right kidney adjacent but not involving the collecting system. This previously measured 1.3 cm.  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  No lymphadenopathy or venous involvement bilaterally.  He is a previous microscopic hematuria workup back in 2008. At that time, in retrospect the CT scan did show a 1.3 cm right renal lesion near the hilum. This was not dictated in report.  Interpreter ID [id number]   PMH: Past Medical History:  Diagnosis Date  . Arthritis   . Diabetes ([place]) 03/26/2013  . GERD (gastroesophageal reflux disease) 03/26/2013  . Gout   . History of hematuria   . HTN (hypertension) 03/26/2013    Surgical History: Past Surgical History:  Procedure Laterality Date  . APPENDECTOMY    . ROBOTIC ASSITED PARTIAL NEPHRECTOMY Left 08/29/2016   Procedure: ROBOTIC ASSITED PARTIAL NEPHRECTOMY;  Surgeon: Hollice Espy, MD;  Location: [facility] ORS;  Service: Urology;  Laterality: Left;  . UPPER GI ENDOSCOPY      Home Medications:  Allergies as of 09/29/2016      Reactions   Azithromycin     Other reaction(s): Other (See Comments)  Pt states that he has hiccups when he takes this medication      Medication List       Accurate as of 09/29/16 11:59 PM. Always use your most recent med list.          amLODipine 5 MG tablet Commonly known as:  NORVASC Take 5 mg by mouth at bedtime.   aspirin EC 81 MG tablet Take 81 mg by mouth 3 (three) times a week.   benazepril 20 MG tablet Commonly known as:  LOTENSIN Take 20 mg by mouth at bedtime.   cholecalciferol 1000 units tablet Commonly known as:  VITAMIN D Take 1,000 Units by mouth daily.   lovastatin 20 MG tablet Commonly known as:  MEVACOR Take 20 mg by mouth at bedtime.   sitaGLIPtin-metformin 50-500 MG tablet Commonly known as:  JANUMET Take 1 tablet by mouth 2 (two) times daily with a meal.            Discharge Care Instructions        Start     Ordered   10/02/16 0000  Chest 1 View    Question Answer Comment  Reason for Exam (SYMPTOM  OR DIAGNOSIS REQUIRED) f/u RCC   Preferred imaging location? [place]   Radiology Contrast Protocol - do NOT remove file path \\[email or web address]      10/02/16 1546   09/29/16 8315  Basic metabolic panel     17/61/60 0926   09/29/16 0000  CBC with Differential/Platelet  09/29/16 0927   09/29/16 0000  CT Abd Wo & W Cm    Question Answer Comment  If indicated for the ordered procedure, I authorize the administration of contrast media per Radiology protocol Yes   Preferred imaging location? [place]   Radiology Contrast Protocol - do NOT remove file path [email or web address]      09/29/16 0950      Allergies:  Allergies  Allergen Reactions  . Azithromycin     Other reaction(s): Other (See Comments)  Pt states that he has hiccups when he takes this medication    Family History: Family History  Problem Relation Age of Onset  . Kidney cancer Mother   . Prostate cancer Neg  Hx   . Bladder Cancer Neg Hx     Social History:  reports that he has never smoked. He has never used smokeless tobacco. He reports that he does not drink alcohol or use drugs.  ROS: UROLOGY Frequent Urination?: No Hard to postpone urination?: No Burning/pain with urination?: No Get up at night to urinate?: No Leakage of urine?: No Urine stream starts and stops?: No Trouble starting stream?: No Do you have to strain to urinate?: No Blood in urine?: No Urinary tract infection?: No Sexually transmitted disease?: No Injury to kidneys or bladder?: No Painful intercourse?: No Weak stream?: No Erection problems?: No Penile pain?: No  Gastrointestinal Nausea?: No Vomiting?: No Indigestion/heartburn?: Yes Diarrhea?: No Constipation?: No  Constitutional Fever: No Night sweats?: No Weight loss?: No Fatigue?: No  Skin Skin rash/lesions?: No Itching?: No  Eyes Blurred vision?: No Double vision?: No  Ears/Nose/Throat Sore throat?: No Sinus problems?: No  Hematologic/Lymphatic Swollen glands?: No Easy bruising?: No  Cardiovascular Leg swelling?: No Chest pain?: No  Respiratory Cough?: No Shortness of breath?: No  Endocrine Excessive thirst?: No  Musculoskeletal Back pain?: No Joint pain?: No  Neurological Headaches?: No Dizziness?: No  Psychologic Depression?: No Anxiety?: No  Physical Exam: BP (!) 144/78   Pulse 98   Ht 5\' 3"  (1.6 m)   Wt 118 lb (53.5 kg)   BMI 20.90 kg/m   Constitutional:  Alert and oriented, No acute distress.  Accompanied by daughter today. HEENT: [place] AT, moist mucus membranes.  Trachea midline, no masses. Cardiovascular: No clubbing, cyanosis, or edema. Respiratory: Normal respiratory effort, no increased work of breathing. GI: Abdomen is soft, nontender, nondistended, no abdominal masses.  Wounds clean dry and intact, well-healed. Skin: No rashes, bruises or suspicious lesions. Neurologic: Grossly intact, no focal  deficits, moving all 4 extremities. Psychiatric: Normal mood and affect.  Laboratory Data: Lab Results  Component Value Date   WBC 8.4 09/29/2016   HGB 13.5 09/29/2016   HCT 39.9 09/29/2016   MCV 83 09/29/2016   PLT 384 (H) 09/29/2016    Lab Results  Component Value Date   CREATININE 0.80 09/29/2016   Urinalysis N/a  Pertinent Imaging: No new interval imaging  Assessment & Plan:    1. Left renal mass S/p partial nephrectomy 08/2016 c/w RCC - upcomplicated F/u BMP/ CBC today Repeat imaging in 6 months- CT abd w/ and w/out contrast and CXR - Basic metabolic panel - CBC with Differential/Platelet - Chest 1 View; Future  2. Right renal mass As previously discussed, minimal interval growth and difficult location Recommend continued surveillance CT in 6 months as above - CT Abd Wo & W Cm; Future - Chest 1 View; Future   Return in about 6 months (around 04/01/2017) for CT abd/ CXR.  Caryl Pina  Erlene Quan, [place] [address] [place], [place] [address] [phone]

## 2016-09-30 LAB — CBC WITH DIFFERENTIAL/PLATELET
BASOS ABS: 0 10*3/uL (ref 0.0–0.2)
BASOS: 0 %
EOS (ABSOLUTE): 0.5 10*3/uL — ABNORMAL HIGH (ref 0.0–0.4)
EOS: 6 %
HEMATOCRIT: 39.9 % (ref 37.5–51.0)
HEMOGLOBIN: 13.5 g/dL (ref 13.0–17.7)
IMMATURE GRANS (ABS): 0 10*3/uL (ref 0.0–0.1)
IMMATURE GRANULOCYTES: 0 %
LYMPHS ABS: 1.8 10*3/uL (ref 0.7–3.1)
Lymphs: 22 %
MCH: 28.2 pg (ref 26.6–33.0)
MCHC: 33.8 g/dL (ref 31.5–35.7)
MCV: 83 fL (ref 79–97)
MONOS ABS: 0.6 10*3/uL (ref 0.1–0.9)
Monocytes: 7 %
NEUTROS ABS: 5.5 10*3/uL (ref 1.4–7.0)
Neutrophils: 65 %
Platelets: 384 10*3/uL — ABNORMAL HIGH (ref 150–379)
RBC: 4.79 x10E6/uL (ref 4.14–5.80)
RDW: 13.4 % (ref 12.3–15.4)
WBC: 8.4 10*3/uL (ref 3.4–10.8)

## 2016-09-30 LAB — BASIC METABOLIC PANEL
BUN / CREAT RATIO: 18 (ref 10–24)
BUN: 14 mg/dL (ref 8–27)
CO2: 26 mmol/L (ref 20–29)
CREATININE: 0.8 mg/dL (ref 0.76–1.27)
Calcium: 9.7 mg/dL (ref 8.6–10.2)
Chloride: 98 mmol/L (ref 96–106)
GFR calc Af Amer: 108 mL/min/{1.73_m2} (ref >59)
GFR calc non Af Amer: 93 mL/min/{1.73_m2} (ref >59)
GLUCOSE: 154 mg/dL — AB (ref 65–99)
Potassium: 4.4 mmol/L (ref 3.5–5.2)
SODIUM: 138 mmol/L (ref 134–144)

## 2016-10-04 DIAGNOSIS — E119 Type 2 diabetes mellitus without complications: Secondary | ICD-10-CM | POA: Diagnosis not present

## 2016-10-04 DIAGNOSIS — R7309 Other abnormal glucose: Secondary | ICD-10-CM | POA: Diagnosis not present

## 2016-10-19 DIAGNOSIS — M7590 Shoulder lesion, unspecified, unspecified shoulder: Secondary | ICD-10-CM | POA: Diagnosis not present

## 2016-10-19 DIAGNOSIS — S43421A Sprain of right rotator cuff capsule, initial encounter: Secondary | ICD-10-CM | POA: Diagnosis not present

## 2016-10-25 DIAGNOSIS — R7309 Other abnormal glucose: Secondary | ICD-10-CM | POA: Diagnosis not present

## 2016-10-25 DIAGNOSIS — Z1389 Encounter for screening for other disorder: Secondary | ICD-10-CM | POA: Diagnosis not present

## 2016-10-25 DIAGNOSIS — R066 Hiccough: Secondary | ICD-10-CM | POA: Diagnosis not present

## 2016-11-02 DIAGNOSIS — C649 Malignant neoplasm of unspecified kidney, except renal pelvis: Secondary | ICD-10-CM | POA: Diagnosis not present

## 2016-11-02 DIAGNOSIS — S134XXS Sprain of ligaments of cervical spine, sequela: Secondary | ICD-10-CM | POA: Diagnosis not present

## 2016-11-02 DIAGNOSIS — S43429S Sprain of unspecified rotator cuff capsule, sequela: Secondary | ICD-10-CM | POA: Diagnosis not present

## 2016-11-02 DIAGNOSIS — J309 Allergic rhinitis, unspecified: Secondary | ICD-10-CM | POA: Diagnosis not present

## 2016-11-27 DIAGNOSIS — M5417 Radiculopathy, lumbosacral region: Secondary | ICD-10-CM | POA: Diagnosis not present

## 2017-01-04 DIAGNOSIS — C649 Malignant neoplasm of unspecified kidney, except renal pelvis: Secondary | ICD-10-CM | POA: Diagnosis not present

## 2017-01-04 DIAGNOSIS — I10 Essential (primary) hypertension: Secondary | ICD-10-CM | POA: Diagnosis not present

## 2017-01-04 DIAGNOSIS — R112 Nausea with vomiting, unspecified: Secondary | ICD-10-CM | POA: Diagnosis not present

## 2017-01-04 DIAGNOSIS — R066 Hiccough: Secondary | ICD-10-CM | POA: Diagnosis not present

## 2017-01-04 DIAGNOSIS — E119 Type 2 diabetes mellitus without complications: Secondary | ICD-10-CM | POA: Diagnosis not present

## 2017-01-04 DIAGNOSIS — K219 Gastro-esophageal reflux disease without esophagitis: Secondary | ICD-10-CM | POA: Diagnosis not present

## 2017-01-04 DIAGNOSIS — R634 Abnormal weight loss: Secondary | ICD-10-CM | POA: Diagnosis not present

## 2017-01-11 DIAGNOSIS — R69 Illness, unspecified: Secondary | ICD-10-CM | POA: Diagnosis not present

## 2017-01-18 DIAGNOSIS — R69 Illness, unspecified: Secondary | ICD-10-CM | POA: Diagnosis not present

## 2017-02-02 DIAGNOSIS — E119 Type 2 diabetes mellitus without complications: Secondary | ICD-10-CM | POA: Diagnosis not present

## 2017-02-02 DIAGNOSIS — M1990 Unspecified osteoarthritis, unspecified site: Secondary | ICD-10-CM | POA: Diagnosis not present

## 2017-02-02 DIAGNOSIS — Z23 Encounter for immunization: Secondary | ICD-10-CM | POA: Diagnosis not present

## 2017-02-02 DIAGNOSIS — K3189 Other diseases of stomach and duodenum: Secondary | ICD-10-CM | POA: Diagnosis not present

## 2017-02-02 DIAGNOSIS — N400 Enlarged prostate without lower urinary tract symptoms: Secondary | ICD-10-CM | POA: Diagnosis not present

## 2017-02-02 DIAGNOSIS — K449 Diaphragmatic hernia without obstruction or gangrene: Secondary | ICD-10-CM | POA: Diagnosis not present

## 2017-02-02 DIAGNOSIS — K2980 Duodenitis without bleeding: Secondary | ICD-10-CM | POA: Diagnosis not present

## 2017-02-02 DIAGNOSIS — I10 Essential (primary) hypertension: Secondary | ICD-10-CM | POA: Diagnosis not present

## 2017-02-02 DIAGNOSIS — K219 Gastro-esophageal reflux disease without esophagitis: Secondary | ICD-10-CM | POA: Diagnosis not present

## 2017-02-02 DIAGNOSIS — R066 Hiccough: Secondary | ICD-10-CM | POA: Diagnosis not present

## 2017-03-30 ENCOUNTER — Ambulatory Visit
Admission: RE | Admit: 2017-03-30 | Discharge: 2017-03-30 | Disposition: A | Discharge status: Home / Self Care | Payer: Medicare HMO | Source: Ambulatory Visit | Attending: Urology | Admitting: Urology

## 2017-03-30 DIAGNOSIS — D49511 Neoplasm of unspecified behavior of right kidney: Secondary | ICD-10-CM | POA: Diagnosis not present

## 2017-03-30 DIAGNOSIS — I2510 Atherosclerotic heart disease of native coronary artery without angina pectoris: Secondary | ICD-10-CM | POA: Insufficient documentation

## 2017-03-30 DIAGNOSIS — C641 Malignant neoplasm of right kidney, except renal pelvis: Secondary | ICD-10-CM | POA: Diagnosis not present

## 2017-03-30 DIAGNOSIS — I700 Atherosclerosis of aorta: Secondary | ICD-10-CM | POA: Diagnosis not present

## 2017-03-30 DIAGNOSIS — N2889 Other specified disorders of kidney and ureter: Secondary | ICD-10-CM

## 2017-03-30 LAB — POCT I-STAT CREATININE: Creatinine, Ser: 1 mg/dL (ref 0.61–1.24)

## 2017-03-30 MED ORDER — IOPAMIDOL (ISOVUE-370) INJECTION 76%
75.0000 mL | Freq: Once | INTRAVENOUS | Status: AC | PRN
  Administered 2017-03-30: 75 mL via INTRAVENOUS

## 2017-04-02 DIAGNOSIS — E119 Type 2 diabetes mellitus without complications: Secondary | ICD-10-CM | POA: Diagnosis not present

## 2017-04-03 ENCOUNTER — Encounter: Payer: Self-pay | Admitting: Urology

## 2017-04-03 ENCOUNTER — Ambulatory Visit: Payer: Medicare HMO | Admitting: Urology

## 2017-04-03 VITALS — BP 130/67 | HR 99 | Ht 63.0 in | Wt 120.4 lb

## 2017-04-03 DIAGNOSIS — N2889 Other specified disorders of kidney and ureter: Secondary | ICD-10-CM | POA: Diagnosis not present

## 2017-04-03 DIAGNOSIS — C642 Malignant neoplasm of left kidney, except renal pelvis: Secondary | ICD-10-CM | POA: Diagnosis not present

## 2017-04-03 NOTE — Progress Notes (Signed)
04/03/2017 1:16 PM   Ricky Jarvis 1950-12-22 [id number]  Referring provider: Theotis Burrow, MD [address] [place], [place] [address]  No chief complaint on file.   HPI:  67 year old male who initially presented with microscopic hematuria found to have small bilateral renal masses.   He underwent left robotic partial nephrectomy on 08/29/2016 which was uncomplicated.  Surgical pathology was consistent with clear cell carcinoma, 1.8 cm, Furhman grade 2, margins negative.  CT urogram from 06/30/2016 revealed a solid enhancing nodule measuring 2 cm arising from the anterior medial right kidney adjacent but not involving the collecting system. This previously measured 1.3 cm in 2008.  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  No lymphadenopathy or venous involvement bilaterally.  Follow-up serial imaging today shows interval enlargement of the right renal mass, now measuring 2.3 x 2.0 x 2.8 cm.  Is in close proximity but without involvement of the left renal vein which remains widely patent.  There is no evidence of lymphadenopathy.  No evidence of recurrence of the left lower pole renal lesion.  He has had issues with appetite both before and after surgery which is a chronic issue.  He has had a 2 lb weight loss. No constipation. No urinary symptoms including no flank pain or gross hematuria.. Overall, he is feeling well.    He is accompanied today by his wife as well as an interpreter.    PMH: Past Medical History:  Diagnosis Date  . Arthritis   . Diabetes ([place]) 03/26/2013  . GERD (gastroesophageal reflux disease) 03/26/2013  . Gout   . History of hematuria   . HTN (hypertension) 03/26/2013    Surgical History: Past Surgical History:  Procedure Laterality Date  . APPENDECTOMY    . ROBOTIC ASSITED PARTIAL NEPHRECTOMY Left 08/29/2016   Procedure: ROBOTIC ASSITED PARTIAL NEPHRECTOMY;  Surgeon: Hollice Espy, MD;  Location: [facility] ORS;  Service: Urology;  Laterality: Left;  . UPPER GI ENDOSCOPY      Home Medications:  Allergies as of 04/03/2017      Reactions   Azithromycin    Other reaction(s): Other (See Comments)  Pt states that he has hiccups when he takes this medication      Medication List        Accurate as of 04/03/17  1:16 PM. Always use your most recent med list.          amLODipine 5 MG tablet Commonly known as:  NORVASC Take 5 mg by mouth at bedtime.   aspirin EC 81 MG tablet Take 81 mg by mouth 3 (three) times a week.   benazepril 20 MG tablet Commonly known as:  LOTENSIN Take 20 mg by mouth at bedtime.   cholecalciferol 1000 units tablet Commonly known as:  VITAMIN D Take 1,000 Units by mouth daily.   lovastatin 20 MG tablet Commonly known as:  MEVACOR Take 20 mg by mouth at bedtime.   rosuvastatin 20 MG tablet Commonly known as:  CRESTOR   sitaGLIPtin-metformin 50-500 MG tablet Commonly known as:  JANUMET Take 1 tablet by mouth 2 (two) times daily with a meal.       Allergies:  Allergies  Allergen Reactions  . Azithromycin     Other reaction(s): Other (See Comments)  Pt states that he has hiccups when he takes this medication    Family History: Family History  Problem Relation Age of Onset  . Kidney cancer Mother   . Prostate cancer Neg Hx   . Bladder Cancer Neg Hx     Social History:  reports that  has never smoked. he has never used smokeless tobacco. He reports that he does not drink alcohol or use drugs.  ROS: UROLOGY Frequent Urination?: No Hard to postpone urination?: No Burning/pain with urination?: No Get up at night to urinate?: No Leakage of urine?: No Urine stream starts and stops?: No Trouble starting stream?: No Do you have to strain to urinate?: No Blood in urine?: No Urinary tract infection?: No Sexually transmitted disease?: No Injury to  kidneys or bladder?: No Painful intercourse?: No Weak stream?: No Erection problems?: No Penile pain?: No  Gastrointestinal Nausea?: No Vomiting?: No Indigestion/heartburn?: No Diarrhea?: No Constipation?: No  Constitutional Fever: No Night sweats?: No Weight loss?: No Fatigue?: No  Skin Skin rash/lesions?: No Itching?: No  Eyes Blurred vision?: No Double vision?: No  Ears/Nose/Throat Sore throat?: No Sinus problems?: No  Hematologic/Lymphatic Swollen glands?: No Easy bruising?: No  Cardiovascular Leg swelling?: No Chest pain?: No  Respiratory Cough?: No Shortness of breath?: No  Endocrine Excessive thirst?: No  Musculoskeletal Back pain?: No Joint pain?: No  Neurological Headaches?: No Dizziness?: No  Psychologic Depression?: No Anxiety?: No  Physical Exam: BP 130/67   Pulse 99   Ht 5\' 3"  (1.6 m)   Wt 120 lb 6.4 oz (54.6 kg)   BMI 21.33 kg/m   Constitutional:  Alert and oriented, No acute distress.  Accompanied by wife and interpreter today. HEENT: [place] AT, moist mucus membranes.  Trachea midline, no masses. Cardiovascular: No clubbing, cyanosis, or edema. Respiratory: Normal respiratory effort, no increased work of breathing. Skin: No rashes, bruises or suspicious lesions. Neurologic: Grossly intact, no focal deficits, moving all 4 extremities. Psychiatric: Normal mood and affect.  Laboratory Data: Lab Results  Component Value Date   WBC 8.4 09/29/2016   HGB 13.5 09/29/2016   HCT 39.9 09/29/2016   MCV 83 09/29/2016   PLT 384 (H) 09/29/2016    Lab Results  Component Value Date   CREATININE 1.00 03/30/2017   Urinalysis N/a  Pertinent Imaging: CLINICAL DATA:  Microscopic hematuria  EXAM: CT ABDOMEN AND PELVIS WITHOUT AND WITH CONTRAST  TECHNIQUE: Multidetector CT imaging of the abdomen and pelvis was performed following the standard protocol before and following the bolus administration of intravenous  contrast.  CONTRAST:  100 cc of Isovue 370  COMPARISON:  08/06/2016  FINDINGS: Lower chest: No acute abnormality.  Hepatobiliary: No focal liver abnormality is seen. No gallstones, gallbladder wall thickening, or biliary dilatation.  Pancreas: Unremarkable. No pancreatic ductal dilatation or surrounding inflammatory changes.  Spleen: Normal in size without focal abnormality.  Adrenals/Urinary Tract: Normal adrenal glands. There is a solid enhancing nodule measuring 2 cm arising from the anteromedial cortex of the right mid kidney, image 24 of series 2. This has increased in size from 2008 when it measured 1.3 cm. A second lesion arises from the inferior cortex of the left kidney measures 2.1 cm, image 34 of series 11. This has cystic and solid components. New from previous exam. Urinary bladder appears normal.  Stomach/Bowel: Stomach is within normal limits. No evidence of bowel wall thickening, distention, or inflammatory changes.  Vascular/Lymphatic: Aortic atherosclerosis. No aneurysm. There is partially occluding calcified and noncalcified plaque involving the right common iliac artery, image 42 of series 7.  Reproductive: Prostate gland appears enlarged.  Other: No abdominal wall  hernia or abnormality. No abdominopelvic ascites.  Musculoskeletal: Degenerative disc disease is identified within the lumbar spine.  IMPRESSION: 1. No acute findings within the abdomen or pelvis. 2. Solid enhancing lesion arising from the anteromedial cortex of the right mid kidney is identified and worrisome for a small renal cell carcinoma. A second lesion arising from the inferior pole of the left kidney is identified is worrisome for cystic renal cell carcinoma. No specific findings identified to suggest renal vein involvement, IVC involvement or upper abdominal adenopathy. 3. Prostate gland enlargement   Electronically Signed   By: Kerby Moors M.D.   On:  07/03/2016 11:42  CT scan personally reviewed today and with the patient  Assessment & Plan:    1. Left renal cell carcinoma S/p partial nephrectomy 08/2016 c/w RCC - upcomplicated NED without evidence of residual or recurrence  2. Right renal mass Significant enlargement of right renal mass, from 2 cm to 2.8 cm in largest diameter 9 over month interval which is highly concerning.  In addition to this, the lesion is in close proximity to the renal vein that has increased concern for involvement in the future should the lesion continued to grow Given the location of this mass adjacent to the collecting system in close proximity to the hilum, I do not feel that partial nephrectomy is a feasible or safe choice-he was offered a second opinion at an academic institution which he would like to pursue I would strongly recommend laparoscopic hand-assisted right radical nephrectomy Risk and benefits of this procedure were discussed in detail including risk of bleeding, infection, damage surrounding structures, conversion to open procedure, other unexpected complications including MI, stroke, or even death. Additionally, radical nephrectomy is a risk factor for progression to end-stage renal disease.  His left kidney is reasonably healthy and normal, thus the risk of need for dialysis is not anticipated, he would likely have a new baseline renal function.  This was discussed in detail.  All questions were answered today.  She would like to seek a second opinion to assess for specifically the feasibility of partial nephrectomy.  If this is not deemed possible, he will return for laparoscopic hand-assisted right nephrectomy here at [facility].  He has requested that his brother-in-law, Dr. Rebecka Apley have access to his imaging.  He will sign a release for this today.  Advised to contact our office once he sought a second opinion in order to be booked for radical nephrectomy as discussed  above.  Hollice Espy, MD  [facility]  I spent 40 min with this patient of which greater than 50% was spent in counseling and coordination of care with the patient.   [address] [place], [place] [address] [phone]

## 2017-04-09 DIAGNOSIS — E119 Type 2 diabetes mellitus without complications: Secondary | ICD-10-CM | POA: Diagnosis not present

## 2017-04-09 DIAGNOSIS — K219 Gastro-esophageal reflux disease without esophagitis: Secondary | ICD-10-CM | POA: Diagnosis not present

## 2017-04-09 DIAGNOSIS — Z7982 Long term (current) use of aspirin: Secondary | ICD-10-CM | POA: Diagnosis not present

## 2017-04-09 DIAGNOSIS — Z7984 Long term (current) use of oral hypoglycemic drugs: Secondary | ICD-10-CM | POA: Diagnosis not present

## 2017-04-09 DIAGNOSIS — Z79899 Other long term (current) drug therapy: Secondary | ICD-10-CM | POA: Diagnosis not present

## 2017-04-09 DIAGNOSIS — I10 Essential (primary) hypertension: Secondary | ICD-10-CM | POA: Diagnosis not present

## 2017-04-09 DIAGNOSIS — N2889 Other specified disorders of kidney and ureter: Secondary | ICD-10-CM | POA: Diagnosis not present

## 2017-04-09 DIAGNOSIS — Z881 Allergy status to other antibiotic agents status: Secondary | ICD-10-CM | POA: Diagnosis not present

## 2017-04-24 DIAGNOSIS — J9811 Atelectasis: Secondary | ICD-10-CM | POA: Diagnosis not present

## 2017-04-26 DIAGNOSIS — N2889 Other specified disorders of kidney and ureter: Secondary | ICD-10-CM | POA: Diagnosis not present

## 2017-04-26 DIAGNOSIS — C642 Malignant neoplasm of left kidney, except renal pelvis: Secondary | ICD-10-CM | POA: Diagnosis not present

## 2017-05-03 DIAGNOSIS — J309 Allergic rhinitis, unspecified: Secondary | ICD-10-CM | POA: Diagnosis not present

## 2017-05-03 DIAGNOSIS — R9431 Abnormal electrocardiogram [ECG] [EKG]: Secondary | ICD-10-CM | POA: Diagnosis not present

## 2017-05-03 DIAGNOSIS — S43429S Sprain of unspecified rotator cuff capsule, sequela: Secondary | ICD-10-CM | POA: Diagnosis not present

## 2017-05-03 DIAGNOSIS — E119 Type 2 diabetes mellitus without complications: Secondary | ICD-10-CM | POA: Diagnosis not present

## 2017-05-03 DIAGNOSIS — C649 Malignant neoplasm of unspecified kidney, except renal pelvis: Secondary | ICD-10-CM | POA: Diagnosis not present

## 2017-05-07 DIAGNOSIS — E119 Type 2 diabetes mellitus without complications: Secondary | ICD-10-CM | POA: Diagnosis not present

## 2017-05-07 DIAGNOSIS — J309 Allergic rhinitis, unspecified: Secondary | ICD-10-CM | POA: Diagnosis not present

## 2017-05-07 DIAGNOSIS — I2510 Atherosclerotic heart disease of native coronary artery without angina pectoris: Secondary | ICD-10-CM | POA: Diagnosis not present

## 2017-05-07 DIAGNOSIS — R9431 Abnormal electrocardiogram [ECG] [EKG]: Secondary | ICD-10-CM | POA: Diagnosis not present

## 2017-05-07 DIAGNOSIS — J399 Disease of upper respiratory tract, unspecified: Secondary | ICD-10-CM | POA: Diagnosis not present

## 2017-05-08 DIAGNOSIS — I10 Essential (primary) hypertension: Secondary | ICD-10-CM | POA: Diagnosis not present

## 2017-05-08 DIAGNOSIS — Z881 Allergy status to other antibiotic agents status: Secondary | ICD-10-CM | POA: Diagnosis not present

## 2017-05-08 DIAGNOSIS — Z7984 Long term (current) use of oral hypoglycemic drugs: Secondary | ICD-10-CM | POA: Diagnosis not present

## 2017-05-08 DIAGNOSIS — N390 Urinary tract infection, site not specified: Secondary | ICD-10-CM | POA: Diagnosis not present

## 2017-05-08 DIAGNOSIS — E7800 Pure hypercholesterolemia, unspecified: Secondary | ICD-10-CM | POA: Diagnosis not present

## 2017-05-08 DIAGNOSIS — N2889 Other specified disorders of kidney and ureter: Secondary | ICD-10-CM | POA: Diagnosis not present

## 2017-05-08 DIAGNOSIS — Z79899 Other long term (current) drug therapy: Secondary | ICD-10-CM | POA: Diagnosis not present

## 2017-05-08 DIAGNOSIS — Z01818 Encounter for other preprocedural examination: Secondary | ICD-10-CM | POA: Diagnosis not present

## 2017-05-08 DIAGNOSIS — E119 Type 2 diabetes mellitus without complications: Secondary | ICD-10-CM | POA: Diagnosis not present

## 2017-05-10 DIAGNOSIS — I2510 Atherosclerotic heart disease of native coronary artery without angina pectoris: Secondary | ICD-10-CM | POA: Diagnosis not present

## 2017-05-10 DIAGNOSIS — R9431 Abnormal electrocardiogram [ECG] [EKG]: Secondary | ICD-10-CM | POA: Diagnosis not present

## 2017-05-10 DIAGNOSIS — C649 Malignant neoplasm of unspecified kidney, except renal pelvis: Secondary | ICD-10-CM | POA: Diagnosis not present

## 2017-05-10 DIAGNOSIS — J399 Disease of upper respiratory tract, unspecified: Secondary | ICD-10-CM | POA: Diagnosis not present

## 2017-05-24 DIAGNOSIS — K6389 Other specified diseases of intestine: Secondary | ICD-10-CM | POA: Diagnosis not present

## 2017-05-24 DIAGNOSIS — D49511 Neoplasm of unspecified behavior of right kidney: Secondary | ICD-10-CM | POA: Diagnosis not present

## 2017-05-24 DIAGNOSIS — R918 Other nonspecific abnormal finding of lung field: Secondary | ICD-10-CM | POA: Diagnosis not present

## 2017-05-24 DIAGNOSIS — K9189 Other postprocedural complications and disorders of digestive system: Secondary | ICD-10-CM | POA: Diagnosis not present

## 2017-05-24 DIAGNOSIS — C641 Malignant neoplasm of right kidney, except renal pelvis: Secondary | ICD-10-CM | POA: Diagnosis not present

## 2017-05-24 DIAGNOSIS — I10 Essential (primary) hypertension: Secondary | ICD-10-CM | POA: Diagnosis not present

## 2017-05-24 DIAGNOSIS — R0989 Other specified symptoms and signs involving the circulatory and respiratory systems: Secondary | ICD-10-CM | POA: Diagnosis not present

## 2017-05-24 DIAGNOSIS — J9811 Atelectasis: Secondary | ICD-10-CM | POA: Diagnosis not present

## 2017-05-24 DIAGNOSIS — Z905 Acquired absence of kidney: Secondary | ICD-10-CM | POA: Diagnosis not present

## 2017-05-24 DIAGNOSIS — E7800 Pure hypercholesterolemia, unspecified: Secondary | ICD-10-CM | POA: Diagnosis not present

## 2017-05-24 DIAGNOSIS — Z4682 Encounter for fitting and adjustment of non-vascular catheter: Secondary | ICD-10-CM | POA: Diagnosis not present

## 2017-05-24 DIAGNOSIS — K219 Gastro-esophageal reflux disease without esophagitis: Secondary | ICD-10-CM | POA: Diagnosis not present

## 2017-05-24 DIAGNOSIS — K668 Other specified disorders of peritoneum: Secondary | ICD-10-CM | POA: Diagnosis not present

## 2017-05-24 DIAGNOSIS — E119 Type 2 diabetes mellitus without complications: Secondary | ICD-10-CM | POA: Diagnosis not present

## 2017-05-24 DIAGNOSIS — J9383 Other pneumothorax: Secondary | ICD-10-CM | POA: Diagnosis not present

## 2017-05-24 DIAGNOSIS — R000 Tachycardia, unspecified: Secondary | ICD-10-CM | POA: Diagnosis not present

## 2017-05-24 DIAGNOSIS — N2889 Other specified disorders of kidney and ureter: Secondary | ICD-10-CM | POA: Diagnosis not present

## 2017-05-24 DIAGNOSIS — J95811 Postprocedural pneumothorax: Secondary | ICD-10-CM | POA: Diagnosis not present

## 2017-05-24 DIAGNOSIS — K567 Ileus, unspecified: Secondary | ICD-10-CM | POA: Diagnosis not present

## 2017-05-24 DIAGNOSIS — J939 Pneumothorax, unspecified: Secondary | ICD-10-CM | POA: Diagnosis not present

## 2017-06-27 DIAGNOSIS — Z23 Encounter for immunization: Secondary | ICD-10-CM | POA: Diagnosis not present

## 2017-06-27 DIAGNOSIS — R7309 Other abnormal glucose: Secondary | ICD-10-CM | POA: Diagnosis not present

## 2017-06-27 DIAGNOSIS — E119 Type 2 diabetes mellitus without complications: Secondary | ICD-10-CM | POA: Diagnosis not present

## 2017-06-27 DIAGNOSIS — Z1389 Encounter for screening for other disorder: Secondary | ICD-10-CM | POA: Diagnosis not present

## 2017-06-27 DIAGNOSIS — Z0000 Encounter for general adult medical examination without abnormal findings: Secondary | ICD-10-CM | POA: Diagnosis not present

## 2017-06-27 DIAGNOSIS — I10 Essential (primary) hypertension: Secondary | ICD-10-CM | POA: Diagnosis not present

## 2017-08-27 DIAGNOSIS — C641 Malignant neoplasm of right kidney, except renal pelvis: Secondary | ICD-10-CM | POA: Diagnosis not present

## 2017-08-27 DIAGNOSIS — Z905 Acquired absence of kidney: Secondary | ICD-10-CM | POA: Diagnosis not present

## 2017-08-27 DIAGNOSIS — I7090 Unspecified atherosclerosis: Secondary | ICD-10-CM | POA: Diagnosis not present

## 2017-08-29 DIAGNOSIS — E119 Type 2 diabetes mellitus without complications: Secondary | ICD-10-CM | POA: Diagnosis not present

## 2017-08-29 DIAGNOSIS — H40003 Preglaucoma, unspecified, bilateral: Secondary | ICD-10-CM | POA: Diagnosis not present

## 2017-08-29 DIAGNOSIS — H2513 Age-related nuclear cataract, bilateral: Secondary | ICD-10-CM | POA: Diagnosis not present

## 2017-09-03 DIAGNOSIS — N2889 Other specified disorders of kidney and ureter: Secondary | ICD-10-CM | POA: Diagnosis not present

## 2017-09-03 DIAGNOSIS — K219 Gastro-esophageal reflux disease without esophagitis: Secondary | ICD-10-CM | POA: Diagnosis not present

## 2017-09-03 DIAGNOSIS — C649 Malignant neoplasm of unspecified kidney, except renal pelvis: Secondary | ICD-10-CM | POA: Diagnosis not present

## 2017-09-03 DIAGNOSIS — I10 Essential (primary) hypertension: Secondary | ICD-10-CM | POA: Diagnosis not present

## 2017-09-03 DIAGNOSIS — Z79899 Other long term (current) drug therapy: Secondary | ICD-10-CM | POA: Diagnosis not present

## 2017-09-03 DIAGNOSIS — Z85528 Personal history of other malignant neoplasm of kidney: Secondary | ICD-10-CM | POA: Diagnosis not present

## 2017-09-03 DIAGNOSIS — Z08 Encounter for follow-up examination after completed treatment for malignant neoplasm: Secondary | ICD-10-CM | POA: Diagnosis not present

## 2017-09-03 DIAGNOSIS — Z905 Acquired absence of kidney: Secondary | ICD-10-CM | POA: Diagnosis not present

## 2017-09-03 DIAGNOSIS — J939 Pneumothorax, unspecified: Secondary | ICD-10-CM | POA: Diagnosis not present

## 2017-09-03 DIAGNOSIS — E119 Type 2 diabetes mellitus without complications: Secondary | ICD-10-CM | POA: Diagnosis not present

## 2017-09-03 DIAGNOSIS — E7800 Pure hypercholesterolemia, unspecified: Secondary | ICD-10-CM | POA: Diagnosis not present

## 2017-09-03 DIAGNOSIS — Z7982 Long term (current) use of aspirin: Secondary | ICD-10-CM | POA: Diagnosis not present

## 2017-09-03 DIAGNOSIS — C642 Malignant neoplasm of left kidney, except renal pelvis: Secondary | ICD-10-CM | POA: Diagnosis not present

## 2017-09-17 DIAGNOSIS — I10 Essential (primary) hypertension: Secondary | ICD-10-CM | POA: Diagnosis not present

## 2017-09-17 DIAGNOSIS — E119 Type 2 diabetes mellitus without complications: Secondary | ICD-10-CM | POA: Diagnosis not present

## 2017-09-19 DIAGNOSIS — H43812 Vitreous degeneration, left eye: Secondary | ICD-10-CM | POA: Diagnosis not present

## 2017-09-19 DIAGNOSIS — H401111 Primary open-angle glaucoma, right eye, mild stage: Secondary | ICD-10-CM | POA: Diagnosis not present

## 2017-09-19 DIAGNOSIS — E119 Type 2 diabetes mellitus without complications: Secondary | ICD-10-CM | POA: Diagnosis not present

## 2017-09-19 DIAGNOSIS — H401123 Primary open-angle glaucoma, left eye, severe stage: Secondary | ICD-10-CM | POA: Diagnosis not present

## 2017-10-17 DIAGNOSIS — R69 Illness, unspecified: Secondary | ICD-10-CM | POA: Diagnosis not present

## 2017-10-17 DIAGNOSIS — E119 Type 2 diabetes mellitus without complications: Secondary | ICD-10-CM | POA: Diagnosis not present

## 2017-10-17 DIAGNOSIS — R7309 Other abnormal glucose: Secondary | ICD-10-CM | POA: Diagnosis not present

## 2017-10-17 DIAGNOSIS — Z1389 Encounter for screening for other disorder: Secondary | ICD-10-CM | POA: Diagnosis not present

## 2017-10-17 DIAGNOSIS — F321 Major depressive disorder, single episode, moderate: Secondary | ICD-10-CM | POA: Diagnosis not present

## 2017-11-02 DIAGNOSIS — E119 Type 2 diabetes mellitus without complications: Secondary | ICD-10-CM | POA: Diagnosis not present

## 2017-11-02 DIAGNOSIS — I10 Essential (primary) hypertension: Secondary | ICD-10-CM | POA: Diagnosis not present

## 2017-11-12 DIAGNOSIS — R7309 Other abnormal glucose: Secondary | ICD-10-CM | POA: Diagnosis not present

## 2017-11-12 DIAGNOSIS — Z1389 Encounter for screening for other disorder: Secondary | ICD-10-CM | POA: Diagnosis not present

## 2017-11-12 DIAGNOSIS — R69 Illness, unspecified: Secondary | ICD-10-CM | POA: Diagnosis not present

## 2017-11-12 DIAGNOSIS — F321 Major depressive disorder, single episode, moderate: Secondary | ICD-10-CM | POA: Diagnosis not present

## 2017-11-13 DIAGNOSIS — I10 Essential (primary) hypertension: Secondary | ICD-10-CM | POA: Diagnosis not present

## 2017-11-13 DIAGNOSIS — E119 Type 2 diabetes mellitus without complications: Secondary | ICD-10-CM | POA: Diagnosis not present

## 2017-11-13 DIAGNOSIS — H40003 Preglaucoma, unspecified, bilateral: Secondary | ICD-10-CM | POA: Diagnosis not present

## 2017-11-14 DIAGNOSIS — R69 Illness, unspecified: Secondary | ICD-10-CM | POA: Diagnosis not present

## 2017-12-04 DIAGNOSIS — H4010X3 Unspecified open-angle glaucoma, severe stage: Secondary | ICD-10-CM | POA: Diagnosis not present

## 2017-12-04 DIAGNOSIS — H4010X1 Unspecified open-angle glaucoma, mild stage: Secondary | ICD-10-CM | POA: Diagnosis not present

## 2017-12-11 DIAGNOSIS — E785 Hyperlipidemia, unspecified: Secondary | ICD-10-CM | POA: Diagnosis not present

## 2017-12-11 DIAGNOSIS — K219 Gastro-esophageal reflux disease without esophagitis: Secondary | ICD-10-CM | POA: Diagnosis not present

## 2017-12-11 DIAGNOSIS — I10 Essential (primary) hypertension: Secondary | ICD-10-CM | POA: Diagnosis not present

## 2017-12-11 DIAGNOSIS — R69 Illness, unspecified: Secondary | ICD-10-CM | POA: Diagnosis not present

## 2017-12-11 DIAGNOSIS — R7309 Other abnormal glucose: Secondary | ICD-10-CM | POA: Diagnosis not present

## 2017-12-11 DIAGNOSIS — E119 Type 2 diabetes mellitus without complications: Secondary | ICD-10-CM | POA: Diagnosis not present

## 2017-12-31 DIAGNOSIS — E119 Type 2 diabetes mellitus without complications: Secondary | ICD-10-CM | POA: Diagnosis not present

## 2017-12-31 DIAGNOSIS — Z905 Acquired absence of kidney: Secondary | ICD-10-CM | POA: Diagnosis not present

## 2018-02-18 DIAGNOSIS — C642 Malignant neoplasm of left kidney, except renal pelvis: Secondary | ICD-10-CM | POA: Diagnosis not present

## 2018-02-18 DIAGNOSIS — Z905 Acquired absence of kidney: Secondary | ICD-10-CM | POA: Diagnosis not present

## 2018-02-18 DIAGNOSIS — E119 Type 2 diabetes mellitus without complications: Secondary | ICD-10-CM | POA: Diagnosis not present

## 2018-02-18 DIAGNOSIS — I10 Essential (primary) hypertension: Secondary | ICD-10-CM | POA: Diagnosis not present

## 2018-02-18 DIAGNOSIS — Z91018 Allergy to other foods: Secondary | ICD-10-CM | POA: Diagnosis not present

## 2018-02-18 DIAGNOSIS — Z79899 Other long term (current) drug therapy: Secondary | ICD-10-CM | POA: Diagnosis not present

## 2018-02-18 DIAGNOSIS — E7800 Pure hypercholesterolemia, unspecified: Secondary | ICD-10-CM | POA: Diagnosis not present

## 2018-02-18 DIAGNOSIS — C641 Malignant neoplasm of right kidney, except renal pelvis: Secondary | ICD-10-CM | POA: Diagnosis not present

## 2018-05-01 DIAGNOSIS — E119 Type 2 diabetes mellitus without complications: Secondary | ICD-10-CM | POA: Diagnosis not present

## 2018-05-22 DIAGNOSIS — E119 Type 2 diabetes mellitus without complications: Secondary | ICD-10-CM | POA: Diagnosis not present

## 2018-06-04 DIAGNOSIS — R69 Illness, unspecified: Secondary | ICD-10-CM | POA: Diagnosis not present

## 2018-06-25 DIAGNOSIS — E119 Type 2 diabetes mellitus without complications: Secondary | ICD-10-CM | POA: Diagnosis not present

## 2018-06-25 DIAGNOSIS — I10 Essential (primary) hypertension: Secondary | ICD-10-CM | POA: Diagnosis not present

## 2018-07-12 DIAGNOSIS — I10 Essential (primary) hypertension: Secondary | ICD-10-CM | POA: Diagnosis not present

## 2018-07-16 DIAGNOSIS — E119 Type 2 diabetes mellitus without complications: Secondary | ICD-10-CM | POA: Diagnosis not present

## 2018-07-21 IMAGING — CT CT ABDOMEN WO/W CM
3 of 12 series · 9 of 46 positions shown, 15 images · IV contrast (iopamidol)
Comparison: CT the abdomen and pelvis 06/30/2016. Moderate hiatal
hernia..

CLINICAL DATA: 66-year-old male with history of partial left
nephrectomy in August 2016 for renal cell carcinoma presenting with
microscopic hematuria.

EXAM:
CT ABDOMEN WITHOUT AND WITH CONTRAST
TECHNIQUE: Multidetector CT imaging of the abdomen was performed following the
standard protocol before and following the bolus administration of
intravenous contrast.
CONTRAST:  75mL HSMLTU-7D8 IOPAMIDOL (HSMLTU-7D8) INJECTION 76%

[Series 2: renal without · axial · non-contrast · 0.56mm/px · z∈[-1360,-1186]mm · 6 of 123 slices shown, 11 images (1 of 2)]
[im 18/123  soft-tissue]
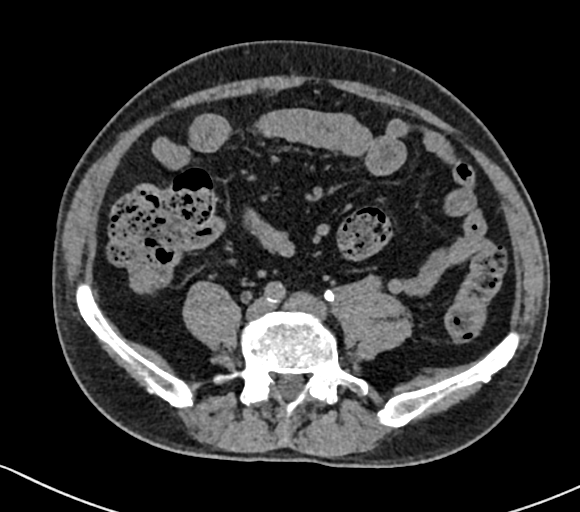
[im 18/123  bone]
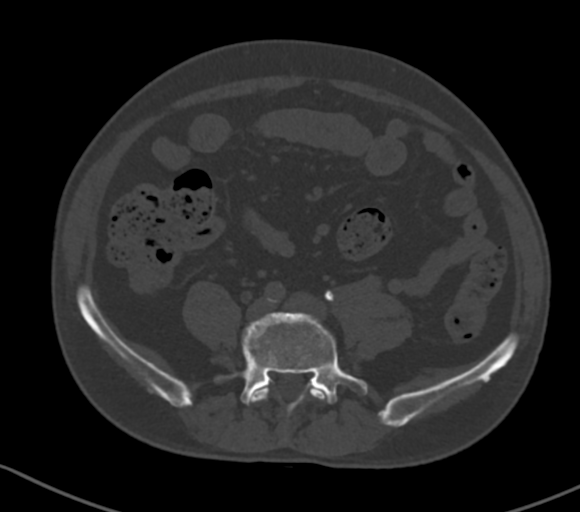
[im 35/123  soft-tissue]
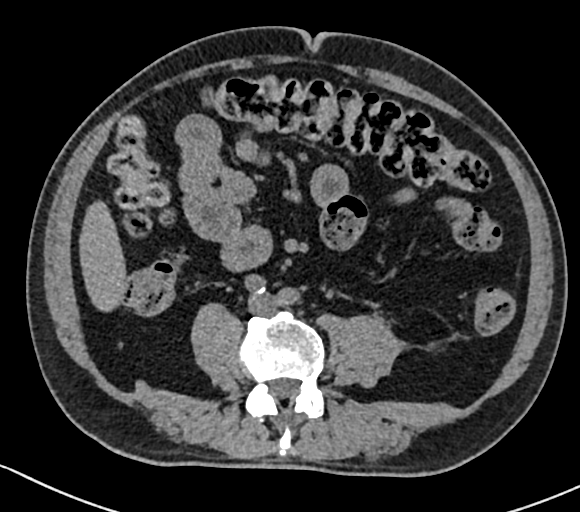
[im 53/123  soft-tissue]
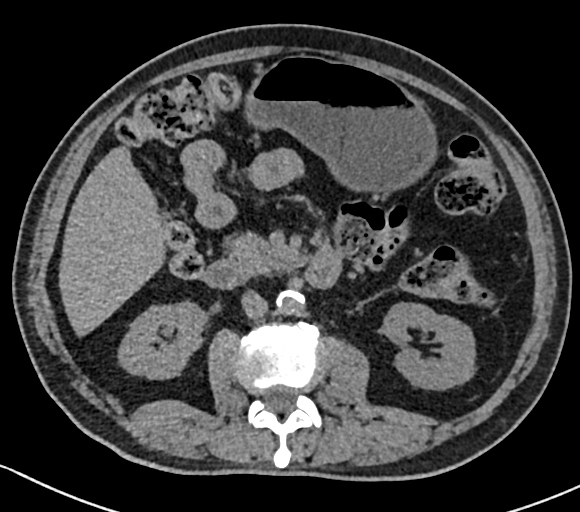
[im 53/123  lung]
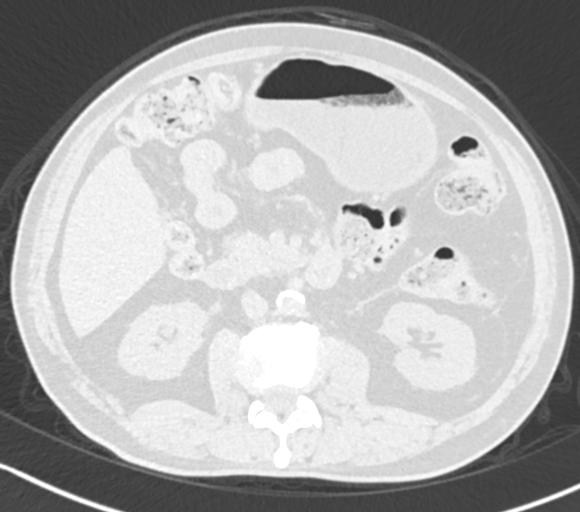
[im 70/123  soft-tissue]
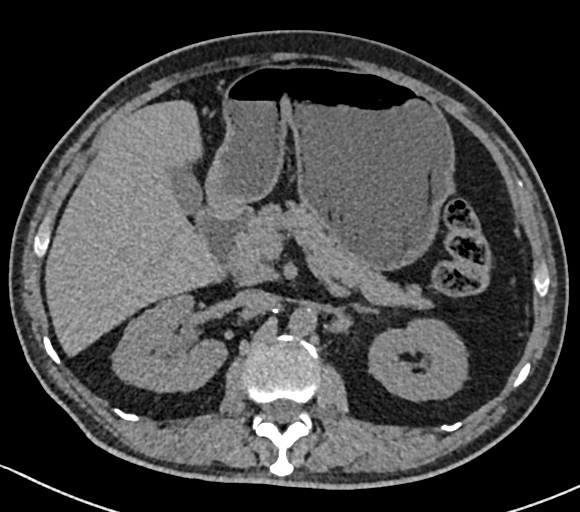
[im 70/123  lung]
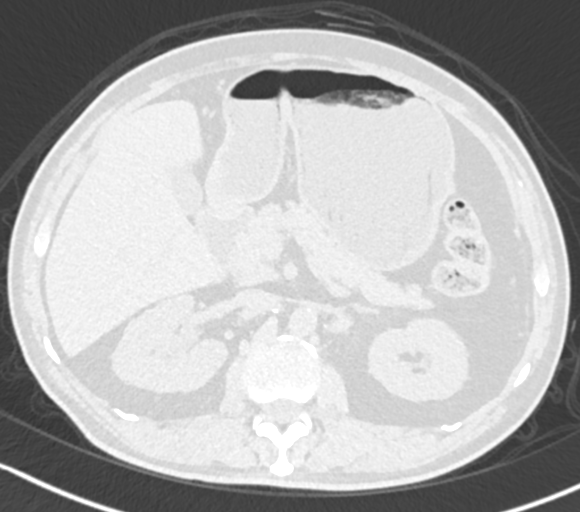
[im 88/123  soft-tissue]
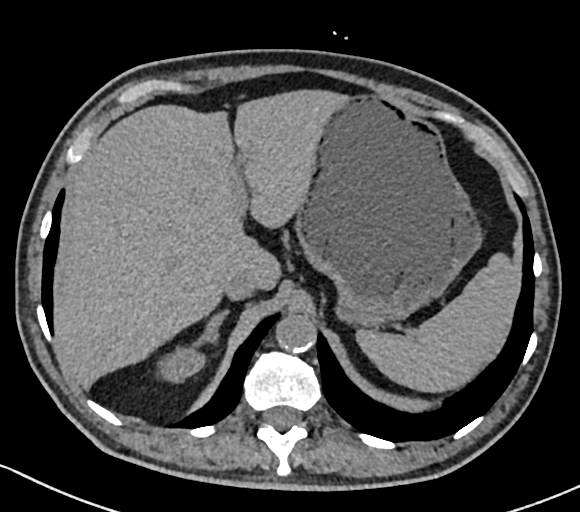
[im 88/123  lung]
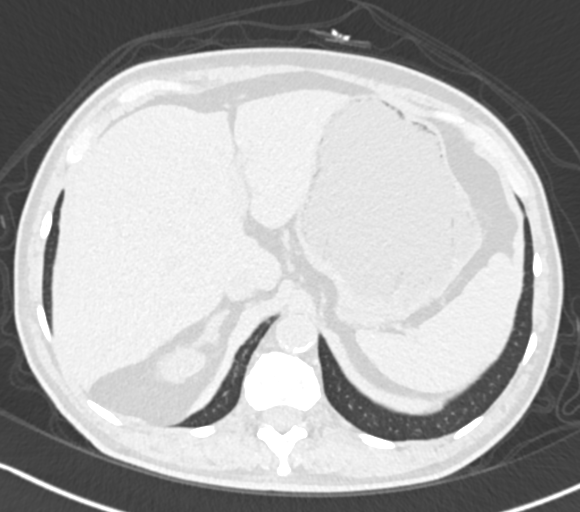
[im 105/123  soft-tissue]
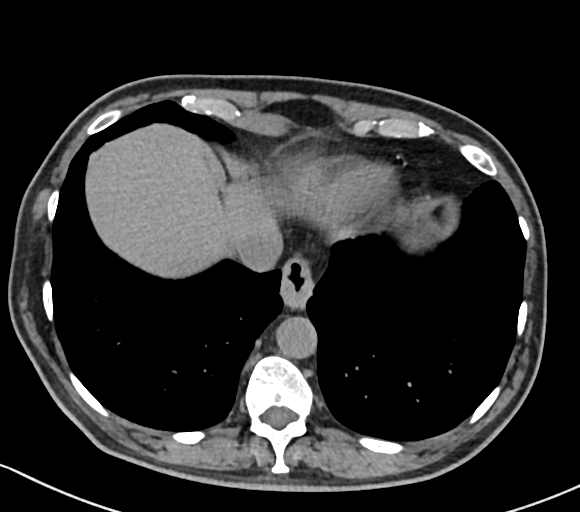
[im 105/123  lung]
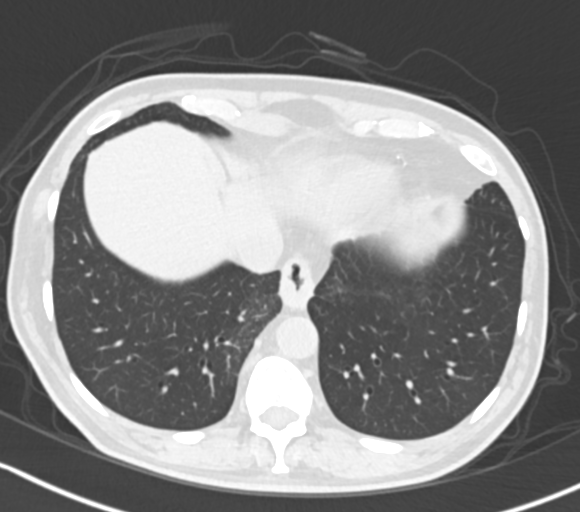

[Series 4: renal without · coronal · non-contrast · 0.48mm/px · 2 of 142 slices shown, 3 images (2 of 2)]
[im 48/142  soft-tissue]
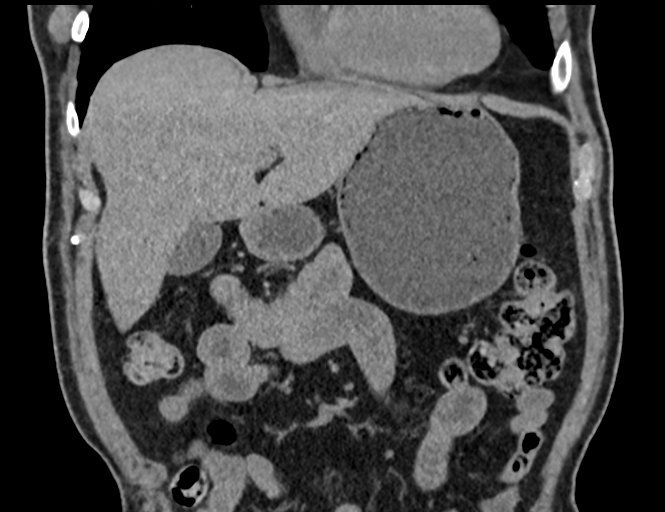
[im 48/142  bone]
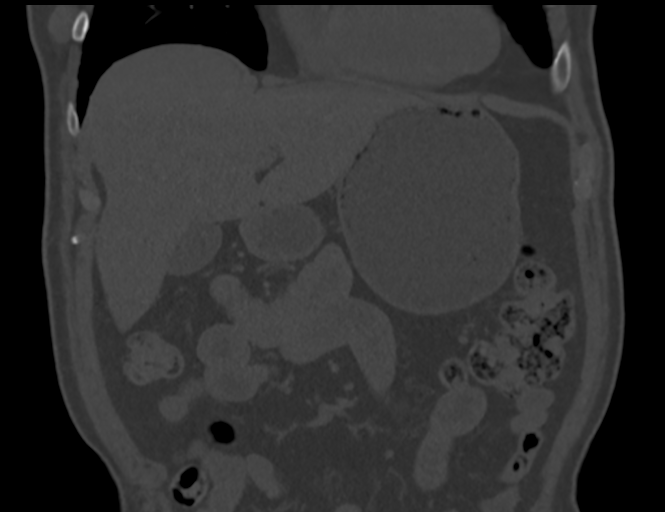
[im 95/142  soft-tissue]
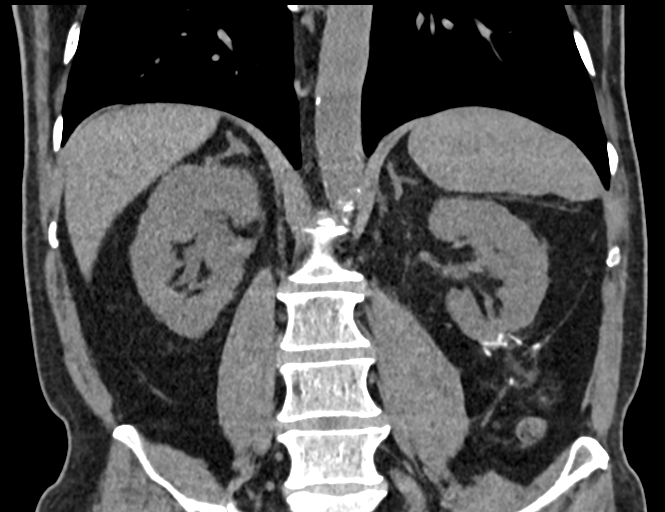

[Series 20: renal nephrographic · axial · 0.56mm/px · 1 of 119 slices shown]
[im 20/119  soft-tissue]
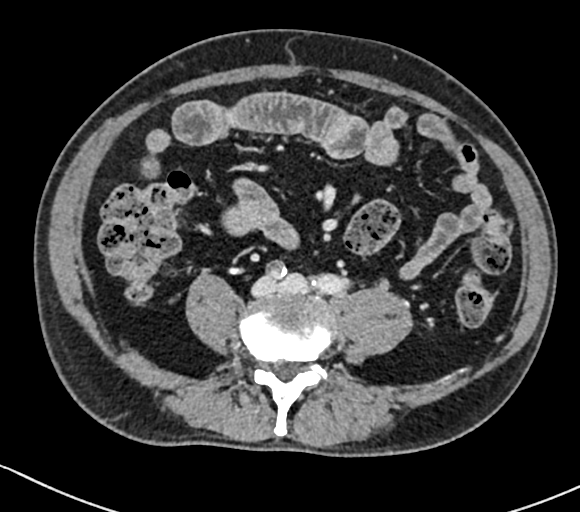

[9 of 46 positions shown; findings below may reference images not displayed]

FINDINGS: Lower chest: Atherosclerotic calcifications in the left anterior
descending, left circumflex and right. Moderate-sized hiatal hernia.

Hepatobiliary: No suspicious appearing cystic or solid hepatic
lesions. No intra or extrahepatic biliary ductal dilatation.
Gallbladder is normal in appearance.

Pancreas: No pancreatic mass. No pancreatic ductal dilatation. No
pancreatic or peripancreatic fluid or inflammatory changes.

Spleen: Unremarkable.

Adrenals/Urinary Tract: Compared to the prior study there has been
partial nephrectomy in the lower pole of the left kidney where there
is focal architectural distortion and some surgical clips. No
unexpected enhancing soft tissue in this region to suggest locally
recurrent disease. In the central aspect of the interpolar region of
the left kidney medially there is an enlarging 2.3 x 2.0 x 2.8 cm
enhancing solid renal neoplasm, highly suspicious for a renal cell
carcinoma. This lesion comes in close proximity to the right renal
vein, but does not appear to extend into the right renal vein at
this time and the right renal vein remains widely patent. Tiny
subcentimeter low-attenuation lesion in the lower pole of the right
kidney is too small to definitively characterize, but is
statistically likely to represent a tiny cyst. No other suspicious
renal lesions. No hydroureteronephrosis in the visualized portions
of the abdomen. Bilateral adrenal glands are normal in appearance.

Stomach/Bowel: The appearance of the stomach is normal. There is no
pathologic dilatation of visualized portions of small bowel or
colon.

Vascular/Lymphatic: Extensive atherosclerosis throughout the
visualize abdominal and pelvic vasculature, without definite
aneurysm or dissection. No lymphadenopathy noted in the abdomen.

Other: No significant volume of ascites and no pneumoperitoneum
noted in the visualized portions of the peritoneal cavity.

Musculoskeletal: There are no aggressive appearing lytic or blastic
lesions noted in the visualized portions of the skeleton.
IMPRESSION: 1. Enlarging enhancing solid renal neoplasm currently measuring
x 2.0 x 2.8 cm in the right kidney, highly concerning for renal cell
carcinoma.
2. Previously noted lesion in the lower pole of the left kidney has
been resected. No findings to suggest locally recurrent disease.
3. No definite signs of metastatic disease in the abdomen.
4. Aortic atherosclerosis, in addition to 3 vessel coronary artery
disease. Please note that although the presence of coronary artery
calcium documents the presence of coronary artery disease, the
severity of this disease and any potential stenosis cannot be
assessed on this non-gated CT examination. Assessment for potential
risk factor modification, dietary therapy or pharmacologic therapy
may be warranted, if clinically indicated.

## 2018-07-24 DIAGNOSIS — E119 Type 2 diabetes mellitus without complications: Secondary | ICD-10-CM | POA: Diagnosis not present

## 2018-07-24 DIAGNOSIS — I10 Essential (primary) hypertension: Secondary | ICD-10-CM | POA: Diagnosis not present

## 2018-08-27 DIAGNOSIS — E785 Hyperlipidemia, unspecified: Secondary | ICD-10-CM | POA: Diagnosis not present

## 2018-08-27 DIAGNOSIS — E119 Type 2 diabetes mellitus without complications: Secondary | ICD-10-CM | POA: Diagnosis not present

## 2018-08-27 DIAGNOSIS — I10 Essential (primary) hypertension: Secondary | ICD-10-CM | POA: Diagnosis not present

## 2018-10-01 DIAGNOSIS — S43429S Sprain of unspecified rotator cuff capsule, sequela: Secondary | ICD-10-CM | POA: Diagnosis not present

## 2018-10-01 DIAGNOSIS — R69 Illness, unspecified: Secondary | ICD-10-CM | POA: Diagnosis not present

## 2018-10-01 DIAGNOSIS — J309 Allergic rhinitis, unspecified: Secondary | ICD-10-CM | POA: Diagnosis not present

## 2018-10-01 DIAGNOSIS — Z23 Encounter for immunization: Secondary | ICD-10-CM | POA: Diagnosis not present

## 2018-10-01 DIAGNOSIS — C649 Malignant neoplasm of unspecified kidney, except renal pelvis: Secondary | ICD-10-CM | POA: Diagnosis not present

## 2018-10-01 DIAGNOSIS — E119 Type 2 diabetes mellitus without complications: Secondary | ICD-10-CM | POA: Diagnosis not present

## 2018-10-02 DIAGNOSIS — E7849 Other hyperlipidemia: Secondary | ICD-10-CM | POA: Diagnosis not present

## 2018-10-02 DIAGNOSIS — I10 Essential (primary) hypertension: Secondary | ICD-10-CM | POA: Diagnosis not present

## 2018-10-02 DIAGNOSIS — E034 Atrophy of thyroid (acquired): Secondary | ICD-10-CM | POA: Diagnosis not present

## 2018-10-02 DIAGNOSIS — R5381 Other malaise: Secondary | ICD-10-CM | POA: Diagnosis not present

## 2018-10-02 DIAGNOSIS — E119 Type 2 diabetes mellitus without complications: Secondary | ICD-10-CM | POA: Diagnosis not present

## 2018-10-03 DIAGNOSIS — E119 Type 2 diabetes mellitus without complications: Secondary | ICD-10-CM | POA: Diagnosis not present

## 2018-10-03 DIAGNOSIS — I10 Essential (primary) hypertension: Secondary | ICD-10-CM | POA: Diagnosis not present

## 2018-10-17 DIAGNOSIS — H401134 Primary open-angle glaucoma, bilateral, indeterminate stage: Secondary | ICD-10-CM | POA: Diagnosis not present

## 2018-10-17 DIAGNOSIS — E785 Hyperlipidemia, unspecified: Secondary | ICD-10-CM | POA: Diagnosis not present

## 2018-10-17 DIAGNOSIS — I10 Essential (primary) hypertension: Secondary | ICD-10-CM | POA: Diagnosis not present

## 2018-10-17 DIAGNOSIS — H524 Presbyopia: Secondary | ICD-10-CM | POA: Diagnosis not present

## 2018-10-23 DIAGNOSIS — E119 Type 2 diabetes mellitus without complications: Secondary | ICD-10-CM | POA: Diagnosis not present

## 2018-10-23 DIAGNOSIS — S43429S Sprain of unspecified rotator cuff capsule, sequela: Secondary | ICD-10-CM | POA: Diagnosis not present

## 2018-10-23 DIAGNOSIS — J309 Allergic rhinitis, unspecified: Secondary | ICD-10-CM | POA: Diagnosis not present

## 2018-10-23 DIAGNOSIS — C649 Malignant neoplasm of unspecified kidney, except renal pelvis: Secondary | ICD-10-CM | POA: Diagnosis not present

## 2018-11-06 ENCOUNTER — Other Ambulatory Visit: Payer: Self-pay

## 2018-11-06 ENCOUNTER — Telehealth: Payer: Self-pay

## 2018-11-06 DIAGNOSIS — Z1211 Encounter for screening for malignant neoplasm of colon: Secondary | ICD-10-CM

## 2018-11-06 NOTE — Telephone Encounter (Signed)
Gastroenterology Pre-Procedure Review  Request Date: 11/18/18 Requesting Physician: Dr. Marius Ditch  PATIENT REVIEW QUESTIONS: The patient responded to the following health history questions as indicated:    1. Are you having any GI issues? no 2. Do you have a personal history of Polyps? unsure 3. Do you have a family history of Colon Cancer or Polyps? no 4. Diabetes Mellitus? Yes oral meds 5. Joint replacements in the past 12 months?no 6. Major health problems in the past 3 months?no 7. Any artificial heart valves, MVP, or defibrillator?no    MEDICATIONS & ALLERGIES:    Patient reports the following regarding taking any anticoagulation/antiplatelet therapy:   Plavix, Coumadin, Eliquis, Xarelto, Lovenox, Pradaxa, Brilinta, or Effient? no Aspirin? yes (81 mg daily)  Patient confirms/reports the following medications:  Current Outpatient Medications  Medication Sig Dispense Refill  . amLODipine (NORVASC) 5 MG tablet Take 5 mg by mouth at bedtime.     Marland Kitchen aspirin EC 81 MG tablet Take 81 mg by mouth 3 (three) times a week.    . benazepril (LOTENSIN) 20 MG tablet Take 20 mg by mouth at bedtime.     . cholecalciferol (VITAMIN D) 1000 units tablet Take 1,000 Units by mouth daily.    Marland Kitchen lovastatin (MEVACOR) 20 MG tablet Take 20 mg by mouth at bedtime.     . rosuvastatin (CRESTOR) 20 MG tablet     . sitaGLIPtin-metformin (JANUMET) 50-500 MG tablet Take 1 tablet by mouth 2 (two) times daily with a meal.     No current facility-administered medications for this visit.     Patient confirms/reports the following allergies:  Allergies  Allergen Reactions  . Azithromycin     Other reaction(s): Other (See Comments)  Pt states that he has hiccups when he takes this medication    No orders of the defined types were placed in this encounter.   AUTHORIZATION INFORMATION Primary Insurance: 1D#: Group #:  Secondary Insurance: 1D#: Group #:  SCHEDULE INFORMATION: Date:  11/18/18 Time: Location:[facility]

## 2018-11-14 ENCOUNTER — Other Ambulatory Visit
Admission: RE | Admit: 2018-11-14 | Discharge: 2018-11-14 | Disposition: A | Discharge status: Home / Self Care | Payer: Medicare HMO | Source: Ambulatory Visit | Attending: Gastroenterology | Admitting: Gastroenterology

## 2018-11-14 ENCOUNTER — Other Ambulatory Visit: Payer: Self-pay

## 2018-11-14 DIAGNOSIS — Z20828 Contact with and (suspected) exposure to other viral communicable diseases: Secondary | ICD-10-CM | POA: Insufficient documentation

## 2018-11-14 DIAGNOSIS — Z01812 Encounter for preprocedural laboratory examination: Secondary | ICD-10-CM | POA: Diagnosis not present

## 2018-11-14 LAB — SARS CORONAVIRUS 2 (TAT 6-24 HRS): SARS Coronavirus 2: NEGATIVE

## 2018-11-17 ENCOUNTER — Encounter: Payer: Self-pay | Admitting: Anesthesiology

## 2018-11-18 ENCOUNTER — Other Ambulatory Visit: Payer: Self-pay

## 2018-11-18 ENCOUNTER — Ambulatory Visit: Payer: Medicare HMO | Admitting: Anesthesiology

## 2018-11-18 ENCOUNTER — Encounter
Admission: RE | Disposition: A | Discharge status: Home / Self Care | Payer: Self-pay | Source: Home / Self Care | Attending: Gastroenterology

## 2018-11-18 ENCOUNTER — Ambulatory Visit
Admission: RE | Admit: 2018-11-18 | Discharge: 2018-11-18 | Disposition: A | Discharge status: Home / Self Care | Payer: Medicare HMO | Attending: Gastroenterology | Admitting: Gastroenterology

## 2018-11-18 DIAGNOSIS — Z1211 Encounter for screening for malignant neoplasm of colon: Secondary | ICD-10-CM

## 2018-11-18 DIAGNOSIS — Z79899 Other long term (current) drug therapy: Secondary | ICD-10-CM | POA: Insufficient documentation

## 2018-11-18 DIAGNOSIS — K621 Rectal polyp: Secondary | ICD-10-CM | POA: Diagnosis not present

## 2018-11-18 DIAGNOSIS — K635 Polyp of colon: Secondary | ICD-10-CM | POA: Diagnosis not present

## 2018-11-18 DIAGNOSIS — E119 Type 2 diabetes mellitus without complications: Secondary | ICD-10-CM | POA: Insufficient documentation

## 2018-11-18 DIAGNOSIS — I10 Essential (primary) hypertension: Secondary | ICD-10-CM | POA: Insufficient documentation

## 2018-11-18 DIAGNOSIS — D126 Benign neoplasm of colon, unspecified: Secondary | ICD-10-CM | POA: Diagnosis not present

## 2018-11-18 DIAGNOSIS — Z7982 Long term (current) use of aspirin: Secondary | ICD-10-CM | POA: Insufficient documentation

## 2018-11-18 DIAGNOSIS — Z7984 Long term (current) use of oral hypoglycemic drugs: Secondary | ICD-10-CM | POA: Diagnosis not present

## 2018-11-18 DIAGNOSIS — D128 Benign neoplasm of rectum: Secondary | ICD-10-CM | POA: Diagnosis not present

## 2018-11-18 DIAGNOSIS — D120 Benign neoplasm of cecum: Secondary | ICD-10-CM | POA: Insufficient documentation

## 2018-11-18 HISTORY — PX: COLONOSCOPY WITH PROPOFOL: SHX5780

## 2018-11-18 LAB — GLUCOSE, CAPILLARY: Glucose-Capillary: 124 mg/dL — ABNORMAL HIGH (ref 70–99)

## 2018-11-18 SURGERY — COLONOSCOPY WITH PROPOFOL
Anesthesia: General

## 2018-11-18 MED ORDER — PROPOFOL 10 MG/ML IV BOLUS
INTRAVENOUS | Status: DC | PRN
  Administered 2018-11-18: 40 mg via INTRAVENOUS
  Administered 2018-11-18 (×3): 20 mg via INTRAVENOUS

## 2018-11-18 MED ORDER — PROPOFOL 500 MG/50ML IV EMUL
INTRAVENOUS | Status: DC | PRN
  Administered 2018-11-18: 160 ug/kg/min via INTRAVENOUS

## 2018-11-18 MED ORDER — LIDOCAINE HCL (PF) 2 % IJ SOLN
INTRAMUSCULAR | Status: AC
  Filled 2018-11-18: qty 10

## 2018-11-18 MED ORDER — SODIUM CHLORIDE 0.9 % IV SOLN
INTRAVENOUS | Status: DC
  Administered 2018-11-18 (×2): via INTRAVENOUS

## 2018-11-18 MED ORDER — PROPOFOL 500 MG/50ML IV EMUL
INTRAVENOUS | Status: AC
  Filled 2018-11-18: qty 50

## 2018-11-18 NOTE — Anesthesia Post-op Follow-up Note (Signed)
Anesthesia QCDR form completed.        

## 2018-11-18 NOTE — H&P (Signed)
Cephas Darby, MD [address]  [place]  [place], [place] [address]  Main: [phone]  Fax: [phone] Pager: [phone]  Primary Care Physician:  Theotis Burrow, MD Primary Gastroenterologist:  Dr. Cephas Darby  Pre-Procedure History & Physical: HPI:  Ricky Jarvis is a 68 y.o. male is here for an colonoscopy.   Past Medical History:  Diagnosis Date  . Arthritis   . Diabetes ([place]) 03/26/2013  . GERD (gastroesophageal reflux disease) 03/26/2013  . Gout   . History of hematuria   . HTN (hypertension) 03/26/2013    Past Surgical History:  Procedure Laterality Date  . APPENDECTOMY    . ROBOTIC ASSITED PARTIAL NEPHRECTOMY Left 08/29/2016   Procedure: ROBOTIC ASSITED PARTIAL NEPHRECTOMY;  Surgeon: Hollice Espy, MD;  Location: [facility] ORS;  Service: Urology;  Laterality: Left;  . UPPER GI ENDOSCOPY      Prior to Admission medications   Medication Sig Start Date End Date Taking? Authorizing Provider  amLODipine (NORVASC) 5 MG tablet Take 5 mg by mouth at bedtime.  03/27/16  Yes [provider]  aspirin EC 81 MG tablet Take 81 mg by mouth 3 (three) times a week.   Yes [provider]  cholecalciferol (VITAMIN D) 1000 units tablet Take 1,000 Units by mouth daily.   Yes [provider]  lovastatin (MEVACOR) 20 MG tablet Take 20 mg by mouth at bedtime.  05/29/16  Yes [provider]  rosuvastatin (CRESTOR) 20 MG tablet  02/27/17  Yes [provider]  sitaGLIPtin-metformin (JANUMET) 50-500 MG tablet Take 1 tablet by mouth 2 (two) times daily with a meal.   Yes [provider]  benazepril (LOTENSIN) 20 MG tablet Take 20 mg by mouth at bedtime.  04/13/16   [provider]    Allergies as of 11/06/2018 - Review Complete 04/03/2017  Allergen Reaction Noted  . Azithromycin  03/26/2013    Family History  Problem Relation Age of Onset  . Kidney cancer Mother   . Prostate cancer Neg Hx   . Bladder Cancer  Neg Hx     Social History   Socioeconomic History  . Marital status: Married    Spouse name: Not on file  . Number of children: Not on file  . Years of education: Not on file  . Highest education level: Not on file  Occupational History  . Not on file  Social Needs  . Financial resource strain: Not on file  . Food insecurity    Worry: Not on file    Inability: Not on file  . Transportation needs    Medical: Not on file    Non-medical: Not on file  Tobacco Use  . Smoking status: Never Smoker  . Smokeless tobacco: Never Used  Substance and Sexual Activity  . Alcohol use: No  . Drug use: No  . Sexual activity: Not on file  Lifestyle  . Physical activity    Days per week: Not on file    Minutes per session: Not on file  . Stress: Not on file  Relationships  . Social Herbalist on phone: Not on file    Gets together: Not on file    Attends religious service: Not on file    Active member of club or organization: Not on file    Attends meetings of clubs or organizations: Not on file    Relationship status: Not on file  . Intimate partner violence    Fear of current  or ex partner: Not on file    Emotionally abused: Not on file    Physically abused: Not on file    Forced sexual activity: Not on file  Other Topics Concern  . Not on file  Social History Narrative  . Not on file    Review of Systems: See HPI, otherwise negative ROS  Physical Exam: BP 137/77   Pulse 70   Temp (!) 97.5 F (36.4 C) (Tympanic)   Resp 16   Ht 5\' 3"  (1.6 m)   Wt 54.4 kg   SpO2 100%   BMI 21.26 kg/m  General:   Alert,  pleasant and cooperative in NAD Head:  Normocephalic and atraumatic. Neck:  Supple; no masses or thyromegaly. Lungs:  Clear throughout to auscultation.    Heart:  Regular rate and rhythm. Abdomen:  Soft, nontender and nondistended. Normal bowel sounds, without guarding, and without rebound.   Neurologic:  Alert and  oriented x4;  grossly normal  neurologically.  Impression/Plan: Ricky Jarvis is here for an colonoscopy to be performed for colon cancer screening  Risks, benefits, limitations, and alternatives regarding  colonoscopy have been reviewed with the patient.  Questions have been answered.  All parties agreeable.   Sherri Sear, MD  11/18/2018, 11:36 AM

## 2018-11-18 NOTE — Anesthesia Preprocedure Evaluation (Signed)
Anesthesia Evaluation  Patient identified by MRN, date of birth, ID band Patient awake    Reviewed: Allergy & Precautions, NPO status , Patient's Chart, lab work & pertinent test results, reviewed documented beta blocker date and time   Airway Mallampati: II  TM Distance: >3 FB     Dental  (+) Chipped   Pulmonary           Cardiovascular hypertension, Pt. on medications      Neuro/Psych    GI/Hepatic GERD  ,  Endo/Other  diabetes, Type 2  Renal/GU      Musculoskeletal  (+) Arthritis ,   Abdominal   Peds  Hematology   Anesthesia Other Findings   Reproductive/Obstetrics                             Anesthesia Physical Anesthesia Plan  ASA: II  Anesthesia Plan: General   Post-op Pain Management:    Induction: Intravenous  PONV Risk Score and Plan:   Airway Management Planned:   Additional Equipment:   Intra-op Plan:   Post-operative Plan:   Informed Consent: I have reviewed the patients History and Physical, chart, labs and discussed the procedure including the risks, benefits and alternatives for the proposed anesthesia with the patient or authorized representative who has indicated his/her understanding and acceptance.       Plan Discussed with: CRNA  Anesthesia Plan Comments:         Anesthesia Quick Evaluation

## 2018-11-18 NOTE — Transfer of Care (Signed)
Immediate Anesthesia Transfer of Care Note  Patient: Ricky Jarvis  Procedure(s) Performed: COLONOSCOPY WITH PROPOFOL (N/A )  Patient Location: PACU  Anesthesia Type:General  Level of Consciousness: sedated  Airway & Oxygen Therapy: Patient Spontanous Breathing and Patient connected to nasal cannula oxygen  Post-op Assessment: Report given to RN and Post -op Vital signs reviewed and stable  Post vital signs: Reviewed and stable  Last Vitals:  Vitals Value Taken Time  BP    Temp    Pulse    Resp    SpO2      Last Pain:  Vitals:   11/18/18 1100  TempSrc: Tympanic  PainSc: 0-No pain         Complications: No apparent anesthesia complications

## 2018-11-18 NOTE — Op Note (Signed)
[facility] Gastroenterology Patient Name: Ricky Jarvis Procedure Date: 11/18/2018 11:45 AM MRN: [id number] Account #: [id number] Date of Birth: July 27, 1950 Admit Type: Outpatient Age: 68 Room: [facility] ENDO ROOM 3 Gender: Male Note Status: Finalized Procedure:            Colonoscopy Indications:          Screening for colorectal malignant neoplasm Providers:            Lin Landsman MD, MD Referring MD:         Ricky Jarvis (Referring MD) Medicines:            Monitored Anesthesia Care Complications:        No immediate complications. Estimated blood loss: None. Procedure:            Pre-Anesthesia Assessment:                       - Prior to the procedure, a History and Physical was                        performed, and patient medications and allergies were                        reviewed. The patient is competent. The risks and                        benefits of the procedure and the sedation options and                        risks were discussed with the patient. All questions                        were answered and informed consent was obtained.                        Patient identification and proposed procedure were                        verified by the physician, the nurse, the                        anesthesiologist, the anesthetist and the technician in                        the pre-procedure area in the procedure room in the                        endoscopy suite. Mental Status Examination: alert and                        oriented. Airway Examination: normal oropharyngeal                        airway and neck mobility. Respiratory Examination:                        clear to auscultation. CV Examination: normal.                        Prophylactic Antibiotics: The patient does not require  prophylactic antibiotics. Prior Anticoagulants: The                        patient has taken no previous anticoagulant or                     antiplatelet agents. ASA Grade Assessment: II - A                        patient with mild systemic disease. After reviewing the                        risks and benefits, the patient was deemed in                        satisfactory condition to undergo the procedure. The                        anesthesia plan was to use monitored anesthesia care                        (MAC). Immediately prior to administration of                        medications, the patient was re-assessed for adequacy                        to receive sedatives. The heart rate, respiratory rate,                        oxygen saturations, blood pressure, adequacy of                        pulmonary ventilation, and response to care were                        monitored throughout the procedure. The physical status                        of the patient was re-assessed after the procedure.                       After obtaining informed consent, the colonoscope was                        passed under direct vision. Throughout the procedure,                        the patient's blood pressure, pulse, and oxygen                        saturations were monitored continuously. The                        Colonoscope was introduced through the anus and                        advanced to the the cecum, identified by appendiceal                        orifice and ileocecal  valve. The colonoscopy was                        performed without difficulty. The patient tolerated the                        procedure well. The quality of the bowel preparation                        was evaluated using the BBPS [facility] Bowel Preparation                        Scale) with scores of: Right Colon = 3, Transverse                        Colon = 3 and Left Colon = 3 (entire mucosa seen well                        with no residual staining, small fragments of stool or                        opaque liquid). The total BBPS score  equals 9. Findings:      The perianal and digital rectal examinations were normal. Pertinent       negatives include normal sphincter tone and no palpable rectal lesions.      Two sessile polyps were found in the rectum and cecum. The polyps were       diminutive in size. These polyps were removed with a cold biopsy       forceps. Resection and retrieval were complete.      The retroflexed view of the distal rectum and anal verge was normal and       showed no anal or rectal abnormalities. Impression:           - Two diminutive polyps in the rectum and in the cecum,                        removed with a cold biopsy forceps. Resected and                        retrieved.                       - The distal rectum and anal verge are normal on                        retroflexion view. Recommendation:       - Discharge patient to home (with escort).                       - Resume previous diet today.                       - Continue present medications.                       - Await pathology results.                       - Repeat colonoscopy in 7 -10 years for surveillance  based on pathology results. Procedure Code(s):    --- Professional ---                       [phone], Colonoscopy, flexible; with biopsy, single or                        multiple Diagnosis Code(s):    --- Professional ---                       Z12.11, Encounter for screening for malignant neoplasm                        of colon                       K62.1, Rectal polyp                       K63.5, Polyp of colon CPT copyright 2019 American Medical Association. All rights reserved. The codes documented in this report are preliminary and upon coder review may  be revised to meet current compliance requirements. Dr. Ulyess Mort Lin Landsman MD, MD 11/18/2018 12:10:07 PM This report has been signed electronically. Number of Addenda: 0 Note Initiated On: 11/18/2018 11:45 AM Scope  Withdrawal Time: 0 hours 9 minutes 30 seconds  Total Procedure Duration: 0 hours 13 minutes 6 seconds  Estimated Blood Loss: Estimated blood loss: none.      [facility]

## 2018-11-18 NOTE — Anesthesia Postprocedure Evaluation (Signed)
Anesthesia Post Note  Patient: Ricky Jarvis  Procedure(s) Performed: COLONOSCOPY WITH PROPOFOL (N/A )  Patient location during evaluation: Endoscopy Anesthesia Type: General Level of consciousness: awake and alert Pain management: pain level controlled Vital Signs Assessment: post-procedure vital signs reviewed and stable Respiratory status: spontaneous breathing, nonlabored ventilation, respiratory function stable and patient connected to nasal cannula oxygen Cardiovascular status: blood pressure returned to baseline and stable Postop Assessment: no apparent nausea or vomiting Anesthetic complications: no     Last Vitals:  Vitals:   11/18/18 1216 11/18/18 1226  BP: 114/77 133/77  Pulse: 81 79  Resp: 16 17  Temp:  36.4 C  SpO2: 100% 100%    Last Pain:  Vitals:   11/18/18 1226  TempSrc: Tympanic  PainSc: 0-No pain                 Shelah Heatley S

## 2018-11-19 ENCOUNTER — Encounter: Payer: Self-pay | Admitting: Gastroenterology

## 2018-11-19 LAB — SURGICAL PATHOLOGY

## 2018-11-27 ENCOUNTER — Encounter: Payer: Self-pay | Admitting: Urology

## 2018-11-27 ENCOUNTER — Other Ambulatory Visit: Payer: Self-pay

## 2018-11-27 ENCOUNTER — Ambulatory Visit (INDEPENDENT_AMBULATORY_CARE_PROVIDER_SITE_OTHER): Payer: Medicare HMO | Admitting: Urology

## 2018-11-27 VITALS — BP 118/69 | HR 94 | Ht 63.0 in | Wt 119.0 lb

## 2018-11-27 DIAGNOSIS — R972 Elevated prostate specific antigen [PSA]: Secondary | ICD-10-CM | POA: Diagnosis not present

## 2018-11-27 DIAGNOSIS — N402 Nodular prostate without lower urinary tract symptoms: Secondary | ICD-10-CM | POA: Diagnosis not present

## 2018-11-27 DIAGNOSIS — C642 Malignant neoplasm of left kidney, except renal pelvis: Secondary | ICD-10-CM | POA: Diagnosis not present

## 2018-11-27 NOTE — Progress Notes (Signed)
11/27/2018 8:49 AM   Ricky Jarvis 03-03-1950 [id number]  Referring provider: Theotis Burrow, MD [address] [place],  [place] [address]  Chief Complaint  Patient presents with  . Elevated PSA    HPI: 68 year old male known to me for history of bilateral renal cell carcinoma who returns today with a new urologic issue, elevated PSA.  As part of his routine annual labs, he underwent PSA testing at which time his PSA was noted to be markedly elevated 8.1 ng/mL.  This was on 10/02/2018.  His only previous PSA available today was 6.4 back in 2018, free PSA 21.3%.  He does have a known history of prostamegaly appreciated previous CT scans as well as cystoscopically with a median lobe on hematuria evaluation in 2018.  He denies any family history of prostate cancer.  He does have a mildly slow urinary stream and gets up once a night to void.  He does feel like he empties bladder all the way without frequency or urgency.  He is not bothered by his symptoms.  He takes no BPH meds.  Most recent creatinine 1.59.  He underwent left robotic partial nephrectomy on 08/29/2016 which was uncomplicated. Surgical pathology was consistent with clear cell carcinoma, 1.8 cm, Furhman grade 2, margins negative. Then had follow-up imaging on 03/30/17 which showed interval enlargement of the right renal mass to 2.3 x 2.0 x 2.8 cm.   He underwent a right robotic radical nephrectomy on 05/24/2017. Intraoperatively, he had a small pleural entry requiring closure. Small pneumothorax that improved and clinically asymptomatic. Pathology showed pT3a, NG 3/4, - margins.   He was last seen at [facility] in January 2020 and scheduled follow-up in 6 months.  He has yet to do so.  PMH: Past Medical History:  Diagnosis Date  . Arthritis   . Diabetes ([place]) 03/26/2013  . GERD (gastroesophageal reflux disease) 03/26/2013  . Gout   . History of hematuria   . HTN (hypertension) 03/26/2013    Surgical History:  Past Surgical History:  Procedure Laterality Date  . APPENDECTOMY    . COLONOSCOPY WITH PROPOFOL N/A 11/18/2018   Procedure: COLONOSCOPY WITH PROPOFOL;  Surgeon: Lin Landsman, MD;  [facility] ENDOSCOPY;  Service: Gastroenterology;  Laterality: N/A;  . ROBOTIC ASSITED PARTIAL NEPHRECTOMY Left 08/29/2016   Procedure: ROBOTIC ASSITED PARTIAL NEPHRECTOMY;  Surgeon: Hollice Espy, MD;  Location: [facility] ORS;  Service: Urology;  Laterality: Left;  . UPPER GI ENDOSCOPY      Home Medications:  Allergies as of 11/27/2018      Reactions   Pork-derived Products Anaphylaxis   Azithromycin    Other reaction(s): Other (See Comments)  Pt states that he has hiccups when he takes this medication      Medication List       Accurate as of November 27, 2018 11:59 PM. If you have any questions, ask your nurse or doctor.        amLODipine 5 MG tablet Commonly known as: NORVASC Take 5 mg by mouth at bedtime.   aspirin EC 81 MG tablet Take 81 mg by mouth 3 (three) times a week.   benazepril 20 MG tablet Commonly known as: LOTENSIN Take 20 mg by mouth at bedtime.   cholecalciferol 1000 units tablet Commonly known as: VITAMIN D Take 1,000 Units by mouth daily.   lovastatin 20 MG tablet Commonly known as: MEVACOR Take 20 mg by mouth at bedtime.   rosuvastatin 20 MG tablet Commonly known as: CRESTOR  sitaGLIPtin-metformin 50-500 MG tablet Commonly known as: JANUMET Take 1 tablet by mouth 2 (two) times daily with a meal.       Allergies:  Allergies  Allergen Reactions  . Pork-Derived Products Anaphylaxis  . Azithromycin     Other reaction(s): Other (See Comments)  Pt states that he has hiccups when he takes this medication    Family History: Family History  Problem Relation Age of Onset  . Kidney cancer Mother   . Prostate cancer Neg Hx   . Bladder Cancer Neg Hx     Social History:  reports that he has never smoked. He has never used smokeless tobacco. He reports  that he does not drink alcohol or use drugs.  ROS: UROLOGY Frequent Urination?: No Hard to postpone urination?: No Burning/pain with urination?: No Get up at night to urinate?: No Leakage of urine?: No Urine stream starts and stops?: No Trouble starting stream?: No Do you have to strain to urinate?: No Blood in urine?: No Urinary tract infection?: No Sexually transmitted disease?: No Injury to kidneys or bladder?: No Painful intercourse?: No Weak stream?: No Erection problems?: No Penile pain?: No  Gastrointestinal Nausea?: No Vomiting?: No Indigestion/heartburn?: No Diarrhea?: No Constipation?: No  Constitutional Fever: No Night sweats?: No Weight loss?: No Fatigue?: No  Skin Skin rash/lesions?: No Itching?: No  Eyes Blurred vision?: No Double vision?: No  Ears/Nose/Throat Sore throat?: No Sinus problems?: No  Hematologic/Lymphatic Swollen glands?: No Easy bruising?: No  Cardiovascular Leg swelling?: No Chest pain?: No  Respiratory Cough?: No Shortness of breath?: No  Endocrine Excessive thirst?: No  Musculoskeletal Back pain?: No Joint pain?: No  Neurological Headaches?: No Dizziness?: No  Psychologic Depression?: No Anxiety?: No  Physical Exam: BP 118/69   Pulse 94   Ht 5\' 3"  (1.6 m)   Wt 119 lb (54 kg)   BMI 21.08 kg/m   Constitutional:  Alert and oriented, No acute distress.  Accompanied by interpreter today. HEENT: [place] AT, moist mucus membranes.  Trachea midline, no masses. Cardiovascular: No clubbing, cyanosis, or edema. Respiratory: Normal respiratory effort, no increased work of breathing. Rectal: Normal sphincter tone.  Prostamegaly with approximately 1 cm nodule on the left base which is somewhat concerning. Skin: No rashes, bruises or suspicious lesions. Neurologic: Grossly intact, no focal deficits, moving all 4 extremities. Psychiatric: Normal mood and affect.  Laboratory Data: Lab Results  Component Value Date    WBC 8.4 09/29/2016   HGB 13.5 09/29/2016   HCT 39.9 09/29/2016   MCV 83 09/29/2016   PLT 384 (H) 09/29/2016    Lab Results  Component Value Date   CREATININE 1.00 03/30/2017   PSA as above  Pertinent Imaging: No recent interval imaging  Assessment & Plan:    1. Elevated PSA  We reviewed the implications of an elevated PSA and the uncertainty surrounding it. In general, a man's PSA increases with age and is produced by both normal and cancerous prostate tissue. Differential for elevated PSA is BPH, prostate cancer, infection, recent intercourse/ejaculation, prostate infarction, recent urethroscopic manipulation (foley placement/cystoscopy) and prostatitis. Management of an elevated PSA can include observation or prostate biopsy and wediscussed this in detail. We discussed that indications for prostate biopsy are defined by age and race specific PSA cutoffs as well as a PSA velocity of 0.75/year.  His PSA is rising at a higher rate than 1 would expect for prostamegaly.  Additionally, he has a prostate nodule which is also highly suspicious.  I strongly recommend a  prostate biopsy.  We discussed prostate biopsy in detail including the procedure itself, the risks of blood in the urine, stool, and ejaculate, serious infection, and discomfort. He is willing to proceed with this as discussed.  We will hold his aspirin prior to the procedure.  No history of heart attack or stroke.  2. Prostate nodule As above  3. Clear cell renal cell carcinoma, left and right [employer]) Answer history as above Due for surveillance imaging in the form of CT abdomen pelvis as well as chest x-ray He would like to continue to have his surveillance here, scans ordered - CT Abdomen Pelvis W Contrast; Future - Chest 1 View; Future   Return for prostate biopsy (please book interpreter).  Hollice Espy, MD  [facility] [address], [place], [place] [address] [phone]

## 2018-11-27 NOTE — Patient Instructions (Signed)

## 2018-11-28 DIAGNOSIS — I10 Essential (primary) hypertension: Secondary | ICD-10-CM | POA: Diagnosis not present

## 2018-11-28 DIAGNOSIS — E119 Type 2 diabetes mellitus without complications: Secondary | ICD-10-CM | POA: Diagnosis not present

## 2018-12-09 DIAGNOSIS — E119 Type 2 diabetes mellitus without complications: Secondary | ICD-10-CM | POA: Diagnosis not present

## 2018-12-09 DIAGNOSIS — R7309 Other abnormal glucose: Secondary | ICD-10-CM | POA: Diagnosis not present

## 2018-12-09 DIAGNOSIS — Z0000 Encounter for general adult medical examination without abnormal findings: Secondary | ICD-10-CM | POA: Diagnosis not present

## 2018-12-09 DIAGNOSIS — Z1389 Encounter for screening for other disorder: Secondary | ICD-10-CM | POA: Diagnosis not present

## 2018-12-16 ENCOUNTER — Ambulatory Visit
Admission: RE | Admit: 2018-12-16 | Discharge: 2018-12-16 | Disposition: A | Discharge status: Home / Self Care | Payer: Medicare HMO | Source: Ambulatory Visit | Attending: Urology | Admitting: Urology

## 2018-12-16 ENCOUNTER — Other Ambulatory Visit: Payer: Self-pay

## 2018-12-16 DIAGNOSIS — C642 Malignant neoplasm of left kidney, except renal pelvis: Secondary | ICD-10-CM | POA: Diagnosis present

## 2018-12-16 DIAGNOSIS — C641 Malignant neoplasm of right kidney, except renal pelvis: Secondary | ICD-10-CM | POA: Diagnosis not present

## 2018-12-16 DIAGNOSIS — Z85528 Personal history of other malignant neoplasm of kidney: Secondary | ICD-10-CM | POA: Diagnosis not present

## 2018-12-16 HISTORY — DX: Malignant (primary) neoplasm, unspecified: C80.1

## 2018-12-16 LAB — POCT I-STAT CREATININE: Creatinine, Ser: 1.6 mg/dL — ABNORMAL HIGH (ref 0.61–1.24)

## 2018-12-16 MED ORDER — IOHEXOL 300 MG/ML  SOLN
75.0000 mL | Freq: Once | INTRAMUSCULAR | Status: AC | PRN
  Administered 2018-12-16: 11:00:00 75 mL via INTRAVENOUS

## 2018-12-18 ENCOUNTER — Ambulatory Visit (INDEPENDENT_AMBULATORY_CARE_PROVIDER_SITE_OTHER): Payer: Medicare HMO | Admitting: Urology

## 2018-12-18 ENCOUNTER — Encounter: Payer: Self-pay | Admitting: Urology

## 2018-12-18 ENCOUNTER — Other Ambulatory Visit: Payer: Self-pay

## 2018-12-18 ENCOUNTER — Other Ambulatory Visit: Payer: Self-pay | Admitting: Urology

## 2018-12-18 VITALS — BP 139/73 | HR 90 | Ht 63.0 in | Wt 117.0 lb

## 2018-12-18 DIAGNOSIS — R972 Elevated prostate specific antigen [PSA]: Secondary | ICD-10-CM | POA: Diagnosis not present

## 2018-12-18 DIAGNOSIS — C642 Malignant neoplasm of left kidney, except renal pelvis: Secondary | ICD-10-CM

## 2018-12-18 MED ORDER — GENTAMICIN SULFATE 40 MG/ML IJ SOLN
80.0000 mg | Freq: Once | INTRAMUSCULAR | Status: AC
  Administered 2018-12-18: 80 mg via INTRAMUSCULAR

## 2018-12-18 MED ORDER — LEVOFLOXACIN 500 MG PO TABS
500.0000 mg | ORAL_TABLET | Freq: Once | ORAL | Status: AC
  Administered 2018-12-18: 500 mg via ORAL

## 2018-12-18 NOTE — Progress Notes (Signed)
   12/18/18  CC:  Chief Complaint  Patient presents with  . Elevated PSA    HPI: 68 year old male with elevated/rising PSA who presents today for prostate biopsy.  Please see previous notes for details.    He also has a history of bilateral renal cell carcinoma status post left robotic partial nephrectomy and right nephrectomy.  Please see previous notes for details.  In the interim, he is undergone restaging CT scan of the abdomen pelvis with contrast on 12/16/2018 which shows no evidence of recurrence.  Incidental prostamegaly was appreciated.  Chest x-ray is negative.  Blood pressure 139/73, pulse 90, height 5\' 3"  (1.6 m), weight 117 lb (53.1 kg). NED. A&Ox3.   No respiratory distress   Abd soft, NT, ND Normal sphincter tone  Prostate Biopsy Procedure   Informed consent was obtained after discussing risks/benefits of the procedure.  A time out was performed to ensure correct patient identity.  Pre-Procedure: - Gentamicin given prophylactically - Levaquin 500 mg administered PO -Transrectal Ultrasound performed revealing a 59.9 gm prostate -No significant hypoechoic or median lobe noted  Procedure: - Prostate block performed using 10 cc 1% lidocaine and biopsies taken from sextant areas, a total of 12 under ultrasound guidance.  Post-Procedure: - Patient tolerated the procedure well - He was counseled to seek immediate medical attention if experiences any severe pain, significant bleeding, or fevers - Return in two week to discuss biopsy results  Assessment/ Plan:  1. Elevated PSA Status post uncomplicated biopsy, see above - levofloxacin (LEVAQUIN) tablet 500 mg - gentamicin (GARAMYCIN) injection 80 mg  2. Clear cell renal cell carcinoma, left (HCC) and right NED Continue every 6 month staging imaging   Hollice Espy, MD

## 2018-12-20 ENCOUNTER — Telehealth: Payer: Self-pay | Admitting: Urology

## 2018-12-20 NOTE — Telephone Encounter (Signed)
ID# [id number]  Left message for patient to call, blood is normal after biopsy

## 2018-12-20 NOTE — Telephone Encounter (Signed)
Patient had a biopsy and is having some blood in his urine. He said not bad but wants to make sure it is ok. Can you call him please

## 2018-12-23 DIAGNOSIS — I10 Essential (primary) hypertension: Secondary | ICD-10-CM | POA: Diagnosis not present

## 2018-12-23 DIAGNOSIS — E785 Hyperlipidemia, unspecified: Secondary | ICD-10-CM | POA: Diagnosis not present

## 2018-12-23 NOTE — Telephone Encounter (Signed)
Pt. Needs to speak to someone about medication and blood in urine. Will need Urdu interpreter.

## 2018-12-24 NOTE — Telephone Encounter (Signed)
ID# [id number]  Left pt message to call,confirm patient's phone number if call back

## 2018-12-26 LAB — ANATOMIC PATHOLOGY REPORT: PDF Image: 0

## 2018-12-30 ENCOUNTER — Other Ambulatory Visit: Payer: Self-pay | Admitting: Urology

## 2018-12-30 DIAGNOSIS — E119 Type 2 diabetes mellitus without complications: Secondary | ICD-10-CM | POA: Diagnosis not present

## 2018-12-31 ENCOUNTER — Telehealth: Payer: Self-pay | Admitting: Urology

## 2018-12-31 DIAGNOSIS — R972 Elevated prostate specific antigen [PSA]: Secondary | ICD-10-CM

## 2018-12-31 DIAGNOSIS — C642 Malignant neoplasm of left kidney, except renal pelvis: Secondary | ICD-10-CM

## 2018-12-31 NOTE — Telephone Encounter (Signed)
Received this patient's prostate biopsy, it is negative.  There is no evidence of malignancy.  There is only acute and chronic inflammation which may be a reason why his PSA started to rise.  I like to see him back in 6 months with a repeat PSA and recheck.  He will be due for another CT scan and chest x-ray as well at that time.  I will go ahead and order this.  Please arrange this appropriate follow-up.  Please use a translator to communicate the above message.  No need to be seen tomorrow.  Hollice Espy, MD

## 2018-12-31 NOTE — Telephone Encounter (Signed)
Patient informed via interpreter services # [phone]  6 month follow up lab appointment-and follow up appointment with Dr. Erlene Quan. Patient verbalized understanding.

## 2019-01-01 ENCOUNTER — Ambulatory Visit: Payer: Medicare HMO | Admitting: Urology

## 2019-01-08 DIAGNOSIS — Z905 Acquired absence of kidney: Secondary | ICD-10-CM | POA: Diagnosis not present

## 2019-01-08 DIAGNOSIS — Z20828 Contact with and (suspected) exposure to other viral communicable diseases: Secondary | ICD-10-CM | POA: Diagnosis not present

## 2019-01-11 DIAGNOSIS — Z03818 Encounter for observation for suspected exposure to other biological agents ruled out: Secondary | ICD-10-CM | POA: Diagnosis not present

## 2019-02-06 DIAGNOSIS — Z20828 Contact with and (suspected) exposure to other viral communicable diseases: Secondary | ICD-10-CM | POA: Diagnosis not present

## 2019-02-12 DIAGNOSIS — Z20828 Contact with and (suspected) exposure to other viral communicable diseases: Secondary | ICD-10-CM | POA: Diagnosis not present

## 2019-02-27 DIAGNOSIS — I10 Essential (primary) hypertension: Secondary | ICD-10-CM | POA: Diagnosis not present

## 2019-02-27 DIAGNOSIS — E119 Type 2 diabetes mellitus without complications: Secondary | ICD-10-CM | POA: Diagnosis not present

## 2019-03-06 DIAGNOSIS — Z23 Encounter for immunization: Secondary | ICD-10-CM | POA: Diagnosis not present

## 2019-03-14 DIAGNOSIS — E119 Type 2 diabetes mellitus without complications: Secondary | ICD-10-CM | POA: Diagnosis not present

## 2019-03-14 DIAGNOSIS — I10 Essential (primary) hypertension: Secondary | ICD-10-CM | POA: Diagnosis not present

## 2019-04-03 DIAGNOSIS — Z23 Encounter for immunization: Secondary | ICD-10-CM | POA: Diagnosis not present

## 2019-04-21 DIAGNOSIS — R69 Illness, unspecified: Secondary | ICD-10-CM | POA: Diagnosis not present

## 2019-04-28 DIAGNOSIS — I10 Essential (primary) hypertension: Secondary | ICD-10-CM | POA: Diagnosis not present

## 2019-04-28 DIAGNOSIS — E119 Type 2 diabetes mellitus without complications: Secondary | ICD-10-CM | POA: Diagnosis not present

## 2019-04-29 DIAGNOSIS — E875 Hyperkalemia: Secondary | ICD-10-CM | POA: Diagnosis not present

## 2019-04-29 DIAGNOSIS — D649 Anemia, unspecified: Secondary | ICD-10-CM | POA: Diagnosis not present

## 2019-04-29 DIAGNOSIS — Z905 Acquired absence of kidney: Secondary | ICD-10-CM | POA: Diagnosis not present

## 2019-04-29 DIAGNOSIS — R718 Other abnormality of red blood cells: Secondary | ICD-10-CM | POA: Diagnosis not present

## 2019-04-29 DIAGNOSIS — N1832 Chronic kidney disease, stage 3b: Secondary | ICD-10-CM | POA: Diagnosis not present

## 2019-05-16 DIAGNOSIS — R809 Proteinuria, unspecified: Secondary | ICD-10-CM | POA: Diagnosis not present

## 2019-05-16 DIAGNOSIS — N1832 Chronic kidney disease, stage 3b: Secondary | ICD-10-CM | POA: Diagnosis not present

## 2019-05-16 DIAGNOSIS — I10 Essential (primary) hypertension: Secondary | ICD-10-CM | POA: Diagnosis not present

## 2019-05-16 DIAGNOSIS — D631 Anemia in chronic kidney disease: Secondary | ICD-10-CM | POA: Diagnosis not present

## 2019-06-02 DIAGNOSIS — E119 Type 2 diabetes mellitus without complications: Secondary | ICD-10-CM | POA: Diagnosis not present

## 2019-06-02 DIAGNOSIS — I10 Essential (primary) hypertension: Secondary | ICD-10-CM | POA: Diagnosis not present

## 2019-06-11 DIAGNOSIS — Z1389 Encounter for screening for other disorder: Secondary | ICD-10-CM | POA: Diagnosis not present

## 2019-06-11 DIAGNOSIS — E119 Type 2 diabetes mellitus without complications: Secondary | ICD-10-CM | POA: Diagnosis not present

## 2019-06-11 DIAGNOSIS — R7309 Other abnormal glucose: Secondary | ICD-10-CM | POA: Diagnosis not present

## 2019-06-25 ENCOUNTER — Other Ambulatory Visit: Payer: Self-pay

## 2019-06-25 ENCOUNTER — Ambulatory Visit
Admission: RE | Admit: 2019-06-25 | Discharge: 2019-06-25 | Disposition: A | Discharge status: Home / Self Care | Payer: Medicare HMO | Source: Ambulatory Visit | Attending: Urology | Admitting: Urology

## 2019-06-25 DIAGNOSIS — C642 Malignant neoplasm of left kidney, except renal pelvis: Secondary | ICD-10-CM | POA: Insufficient documentation

## 2019-06-25 DIAGNOSIS — N400 Enlarged prostate without lower urinary tract symptoms: Secondary | ICD-10-CM | POA: Diagnosis not present

## 2019-06-25 LAB — POCT I-STAT CREATININE: Creatinine, Ser: 1.7 mg/dL — ABNORMAL HIGH (ref 0.61–1.24)

## 2019-06-25 MED ORDER — IOHEXOL 300 MG/ML  SOLN
75.0000 mL | Freq: Once | INTRAMUSCULAR | Status: AC | PRN
  Administered 2019-06-25: 75 mL via INTRAVENOUS

## 2019-06-25 MED ORDER — IOHEXOL 300 MG/ML  SOLN: 100.0000 mL | Freq: Once | INTRAMUSCULAR | Status: DC | PRN

## 2019-06-29 ENCOUNTER — Other Ambulatory Visit: Payer: Self-pay | Admitting: Internal Medicine

## 2019-06-30 ENCOUNTER — Other Ambulatory Visit: Payer: Medicare HMO

## 2019-06-30 ENCOUNTER — Other Ambulatory Visit: Payer: Self-pay

## 2019-06-30 DIAGNOSIS — R972 Elevated prostate specific antigen [PSA]: Secondary | ICD-10-CM

## 2019-07-01 LAB — PSA: Prostate Specific Ag, Serum: 11.3 ng/mL — ABNORMAL HIGH (ref 0.0–4.0)

## 2019-07-01 NOTE — Progress Notes (Signed)
07/02/19 11:33 AM   [name] 11-Oct-1950 [id number]  Referring provider: Theotis Burrow, MD [address] [place],  [place] [address] Chief Complaint  Patient presents with  . Elevated PSA    HPI: Ricky Jarvis is a 69 y.o. M with elevated/rising PSA and history of bilateral renal cell carcinoma status post left robotic partial nephrectomy and right nephrectomy returns today for f/u. He is accompanied by family member.   He underwent prostate bx on 12/18/18 which was negative the setting of rising PSA, please see previous notes for details.  PSA 11.3 as of 06/30/19.   Personal history of bilateral RCC, status post left robotic partial nephrectomy 08/2016 and more recently right robotic nephrectomy 05/2017, P T3a.  He returns today with serial surveillance imaging.  He did not get his chest x-ray today.    Most recently CT A/P w/ contrast from 06/25/19 revealed stable postsurgical change from partial nephrectomy involving theinferior pole of the left kidney. No specific findings identified to suggest residual or recurrence of tumor. Also noted enlarged prostate gland. No recent Chest x-ray.   Creatinine 1.70 as of 06/25/19.    His family member reports of him not eating much and weight loss attributed to worries regarding nutrition. He is interested in a diabetic supplement for his diabetes.   PMH: Past Medical History:  Diagnosis Date  . Arthritis   . Cancer ([place])   . Diabetes ([place]) 03/26/2013  . GERD (gastroesophageal reflux disease) 03/26/2013  . Gout   . History of hematuria   . HTN (hypertension) 03/26/2013    Surgical History: Past Surgical History:  Procedure Laterality Date  . APPENDECTOMY    . COLONOSCOPY WITH PROPOFOL N/A 11/18/2018   Procedure: COLONOSCOPY WITH PROPOFOL;  Surgeon: Lin Landsman, MD;  Location: [facility] ENDOSCOPY;  Service: Gastroenterology;  Laterality: N/A;  . ROBOTIC ASSITED PARTIAL NEPHRECTOMY Left 08/29/2016   Procedure: ROBOTIC  ASSITED PARTIAL NEPHRECTOMY;  Surgeon: Hollice Espy, MD;  Location: [facility] ORS;  Service: Urology;  Laterality: Left;  . UPPER GI ENDOSCOPY      Home Medications:  Allergies as of 07/02/2019      Reactions   Pork-derived Products Anaphylaxis   Azithromycin    Other reaction(s): Other (See Comments)  Pt states that he has hiccups when he takes this medication   Ibuprofen       Medication List       Accurate as of Jul 02, 2019 11:59 PM. If you have any questions, ask your nurse or doctor.        STOP taking these medications   aspirin EC 81 MG tablet Stopped by: Hollice Espy, MD     TAKE these medications   amLODipine 5 MG tablet Commonly known as: NORVASC TAKE 1 TABLET BY MOUTH DAILY   benazepril 20 MG tablet Commonly known as: LOTENSIN Take 20 mg by mouth at bedtime.   cholecalciferol 1000 units tablet Commonly known as: VITAMIN D Take 1,000 Units by mouth daily.   lovastatin 20 MG tablet Commonly known as: MEVACOR Take 20 mg by mouth at bedtime.   omeprazole 20 MG capsule Commonly known as: PRILOSEC Take by mouth.   rosuvastatin 20 MG tablet Commonly known as: CRESTOR   sitaGLIPtin-metformin 50-500 MG tablet Commonly known as: JANUMET Take 1 tablet by mouth 2 (two) times daily with a meal.       Allergies:  Allergies  Allergen Reactions  . Pork-Derived Products Anaphylaxis  . Azithromycin  Other reaction(s): Other (See Comments)  Pt states that he has hiccups when he takes this medication  . Ibuprofen     Family History: Family History  Problem Relation Age of Onset  . Kidney cancer Mother   . Prostate cancer Neg Hx   . Bladder Cancer Neg Hx     Social History:  reports that he has never smoked. He has never used smokeless tobacco. He reports that he does not drink alcohol or use drugs.   Physical Exam: BP 97/61   Pulse 82   Ht 5\' 3"  (1.6 m)   Wt 100 lb (45.4 kg)   BMI 17.71 kg/m   Constitutional:  Alert and oriented, No acute  distress.  Accompanied by wife today. HEENT: [place] AT, moist mucus membranes.  Trachea midline, no masses. Cardiovascular: No clubbing, cyanosis, or edema. Respiratory: Normal respiratory effort, no increased work of breathing. Skin: No rashes, bruises or suspicious lesions. Neurologic: Grossly intact, no focal deficits, moving all 4 extremities. Psychiatric: Normal mood and affect.  Laboratory Data:  Lab Results  Component Value Date   CREATININE 1.70 (H) 06/25/2019   Assessment & Plan:    1. Clear cell renal cell carcinoma, left and right NED Repeat imaging in 6 months  Per NCCN guidelines Need to get Chest X-ray  2. Elevated PSA  Status post uncomplicated biopsy In the setting of continued rising PSA, concern for possible occult lesion Recommended prostate MRI May consider fusion biopsy pending these results, he is agreeable this plan F/u via virtual visit for MRI results   3. Anorexia  Worried about nutrition  Advised to discuss w/ PCP and to try nutritional drink like [place] [address], [place],  [address] [phone]  I, Lucas Mallow, am acting as a scribe for Dr. Hollice Espy,  I have reviewed the above documentation for accuracy and completeness, and I agree with the above.   Hollice Espy, MD  I spent 35  total minutes on the day of the encounter including pre-visit review of the medical record, face-to-face time with the patient, and post visit ordering of labs/imaging/tests.

## 2019-07-02 ENCOUNTER — Ambulatory Visit: Payer: Medicare HMO | Admitting: Urology

## 2019-07-02 ENCOUNTER — Encounter: Payer: Self-pay | Admitting: Urology

## 2019-07-02 ENCOUNTER — Other Ambulatory Visit: Payer: Self-pay

## 2019-07-02 ENCOUNTER — Ambulatory Visit
Admission: RE | Admit: 2019-07-02 | Discharge: 2019-07-02 | Disposition: A | Discharge status: Home / Self Care | Payer: Medicare HMO | Source: Ambulatory Visit | Attending: Urology | Admitting: Urology

## 2019-07-02 VITALS — BP 97/61 | HR 82 | Ht 63.0 in | Wt 100.0 lb

## 2019-07-02 DIAGNOSIS — R972 Elevated prostate specific antigen [PSA]: Secondary | ICD-10-CM | POA: Diagnosis not present

## 2019-07-02 DIAGNOSIS — N402 Nodular prostate without lower urinary tract symptoms: Secondary | ICD-10-CM | POA: Diagnosis not present

## 2019-07-02 DIAGNOSIS — C642 Malignant neoplasm of left kidney, except renal pelvis: Secondary | ICD-10-CM

## 2019-07-02 DIAGNOSIS — C649 Malignant neoplasm of unspecified kidney, except renal pelvis: Secondary | ICD-10-CM | POA: Diagnosis not present

## 2019-07-02 NOTE — Progress Notes (Signed)
Used interpreter services line- Urdu -

## 2019-07-03 ENCOUNTER — Telehealth: Payer: Self-pay

## 2019-07-03 NOTE — Telephone Encounter (Signed)
-----   Message from Hollice Espy, MD sent at 07/02/2019  5:25 PM EDT ----- CXR looks great

## 2019-07-03 NOTE — Telephone Encounter (Signed)
Patient aware of normal cxr. Verbalized understanding.

## 2019-07-17 DIAGNOSIS — I10 Essential (primary) hypertension: Secondary | ICD-10-CM | POA: Diagnosis not present

## 2019-07-17 DIAGNOSIS — E119 Type 2 diabetes mellitus without complications: Secondary | ICD-10-CM | POA: Diagnosis not present

## 2019-08-06 ENCOUNTER — Other Ambulatory Visit: Payer: Self-pay

## 2019-08-06 ENCOUNTER — Other Ambulatory Visit: Payer: Self-pay | Admitting: Internal Medicine

## 2019-08-06 ENCOUNTER — Ambulatory Visit
Admission: RE | Admit: 2019-08-06 | Discharge: 2019-08-06 | Disposition: A | Discharge status: Home / Self Care | Payer: Medicare HMO | Source: Ambulatory Visit | Attending: Urology | Admitting: Urology

## 2019-08-06 DIAGNOSIS — R972 Elevated prostate specific antigen [PSA]: Secondary | ICD-10-CM | POA: Diagnosis not present

## 2019-08-06 DIAGNOSIS — N401 Enlarged prostate with lower urinary tract symptoms: Secondary | ICD-10-CM | POA: Diagnosis not present

## 2019-08-06 MED ORDER — GADOBUTROL 1 MMOL/ML IV SOLN
4.0000 mL | Freq: Once | INTRAVENOUS | Status: AC | PRN
  Administered 2019-08-06: 4 mL via INTRAVENOUS

## 2019-08-12 NOTE — Progress Notes (Signed)
Patient was unavailable for virtual visit.  He was driving and thus will be rescheduled.  Prefer inpatient visit.  We will plan to recheck his PSA prior to his next visit.  Hollice Espy, MD

## 2019-08-13 ENCOUNTER — Telehealth (INDEPENDENT_AMBULATORY_CARE_PROVIDER_SITE_OTHER): Payer: Medicare HMO | Admitting: Urology

## 2019-08-13 ENCOUNTER — Other Ambulatory Visit: Payer: Self-pay

## 2019-08-13 DIAGNOSIS — R972 Elevated prostate specific antigen [PSA]: Secondary | ICD-10-CM

## 2019-08-13 NOTE — Progress Notes (Signed)
This service is provided via telemedicine   No vital signs collected/recorded due to the encounter was a telemedicine visit.     Patient consents to a telephone visit:  yes    Names of all persons participating in the telemedicine service and their role in the encounter:  Cynthis Purington O`Sullivan,RMA   

## 2019-08-15 ENCOUNTER — Ambulatory Visit: Payer: Medicare HMO | Admitting: Internal Medicine

## 2019-08-19 ENCOUNTER — Other Ambulatory Visit: Payer: Self-pay

## 2019-08-19 ENCOUNTER — Encounter: Payer: Self-pay | Admitting: Internal Medicine

## 2019-08-19 ENCOUNTER — Ambulatory Visit (INDEPENDENT_AMBULATORY_CARE_PROVIDER_SITE_OTHER): Payer: Medicare HMO | Admitting: Internal Medicine

## 2019-08-19 VITALS — BP 98/67 | HR 62 | Ht 64.0 in | Wt 113.0 lb

## 2019-08-19 DIAGNOSIS — K219 Gastro-esophageal reflux disease without esophagitis: Secondary | ICD-10-CM

## 2019-08-19 DIAGNOSIS — N2889 Other specified disorders of kidney and ureter: Secondary | ICD-10-CM

## 2019-08-19 DIAGNOSIS — R350 Frequency of micturition: Secondary | ICD-10-CM | POA: Diagnosis not present

## 2019-08-19 DIAGNOSIS — E1169 Type 2 diabetes mellitus with other specified complication: Secondary | ICD-10-CM | POA: Diagnosis not present

## 2019-08-19 DIAGNOSIS — I150 Renovascular hypertension: Secondary | ICD-10-CM

## 2019-08-19 LAB — GLUCOSE, POCT (MANUAL RESULT ENTRY): POC Glucose: 104 mg/dl — AB (ref 70–99)

## 2019-08-19 NOTE — Assessment & Plan Note (Signed)
-   The patient's GERD is stable on medication.  - Instructed the patient to avoid eating spicy and acidic foods, as well as foods high in fat. - Instructed the patient to avoid eating large meals or meals 2-3 hours prior to sleeping. 

## 2019-08-19 NOTE — Assessment & Plan Note (Signed)
Urinary frequency is better now

## 2019-08-19 NOTE — Assessment & Plan Note (Signed)
-   Today, the patient's blood pressure is well managed on present med. - The patient will continue the current treatment regimen.  - I encouraged the patient to eat a low-sodium diet to help control blood pressure. - I encouraged the patient to live an active lifestyle and complete activities that increases heart rate to 85% target heart rate at least 5 times per week for one hour.     

## 2019-08-19 NOTE — Assessment & Plan Note (Signed)
And has left renal carcinoma removed patient has an MRI of the prostate which is unremarkable.

## 2019-08-19 NOTE — Progress Notes (Signed)
Patient ID: Ricky Jarvis, male   DOB: 11-04-1950, 69 y.o.   MRN: [id number]    Established Patient Office Visit  Subjective:  Patient ID: Ricky Jarvis, male    DOB: [birth year]  Age: 69 y.o. MRN: [id number]  CC:  Chief Complaint  Patient presents with  . Diabetes   HPI  Ricky Jarvis presents for a follow up regarding his diabetes. His blood sugar this morning was 104. He has some heartburn and reports a lot of belching. Denies chest pain and shortness of breath. His appetite and allergies are better now. He has some weight loss. He is following up with Dr. Holley Raring, nephrology, for his history of renal cell carcinoma.  Past Medical History:  Diagnosis Date  . Arthritis   . Cancer ([place])   . Diabetes ([place]) 03/26/2013  . GERD (gastroesophageal reflux disease) 03/26/2013  . Gout   . History of hematuria   . HTN (hypertension) 03/26/2013    Past Surgical History:  Procedure Laterality Date  . APPENDECTOMY    . COLONOSCOPY WITH PROPOFOL N/A 11/18/2018   Procedure: COLONOSCOPY WITH PROPOFOL;  Surgeon: Lin Landsman, MD;  Location: [facility] ENDOSCOPY;  Service: Gastroenterology;  Laterality: N/A;  . ROBOTIC ASSITED PARTIAL NEPHRECTOMY Left 08/29/2016   Procedure: ROBOTIC ASSITED PARTIAL NEPHRECTOMY;  Surgeon: Hollice Espy, MD;  Location: [facility] ORS;  Service: Urology;  Laterality: Left;  . UPPER GI ENDOSCOPY      Family History  Problem Relation Age of Onset  . Kidney cancer Mother   . Prostate cancer Neg Hx   . Bladder Cancer Neg Hx     Social History   Socioeconomic History  . Marital status: Married    Spouse name: Not on file  . Number of children: Not on file  . Years of education: Not on file  . Highest education level: Not on file  Occupational History  . Not on file  Tobacco Use  . Smoking status: Never Smoker  . Smokeless tobacco: Never Used  Vaping Use  . Vaping Use: Never used  Substance and Sexual Activity  . Alcohol use: No  . Drug use: No  . Sexual  activity: Not on file  Other Topics Concern  . Not on file  Social History Narrative  . Not on file   Social Determinants of Health   Financial Resource Strain:   . Difficulty of Paying Living Expenses:   Food Insecurity:   . Worried About Charity fundraiser in the Last Year:   . Arboriculturist in the Last Year:   Transportation Needs:   . Film/video editor (Medical):   Marland Kitchen Lack of Transportation (Non-Medical):   Physical Activity:   . Days of Exercise per Week:   . Minutes of Exercise per Session:   Stress:   . Feeling of Stress :   Social Connections:   . Frequency of Communication with Friends and Family:   . Frequency of Social Gatherings with Friends and Family:   . Attends Religious Services:   . Active Member of Clubs or Organizations:   . Attends Archivist Meetings:   Marland Kitchen Marital Status:   Intimate Partner Violence:   . Fear of Current or Ex-Partner:   . Emotionally Abused:   Marland Kitchen Physically Abused:   . Sexually Abused:      Current Outpatient Medications:  .  amLODipine (NORVASC) 5 MG tablet, TAKE 1 TABLET BY MOUTH DAILY, Disp: 90 tablet, Rfl: 3 .  benazepril (LOTENSIN) 20  MG tablet, Take 20 mg by mouth at bedtime. , Disp: , Rfl:  .  cholecalciferol (VITAMIN D) 1000 units tablet, Take 1,000 Units by mouth daily., Disp: , Rfl:  .  JANUMET 50-500 MG tablet, TAKE 1 TABLET BY MOUTH TWICE DAILY, Disp: 180 tablet, Rfl: 3 .  lovastatin (MEVACOR) 20 MG tablet, Take 20 mg by mouth at bedtime. , Disp: , Rfl:  .  omeprazole (PRILOSEC) 20 MG capsule, Take by mouth., Disp: , Rfl:  .  rosuvastatin (CRESTOR) 20 MG tablet, , Disp: , Rfl:    Allergies  Allergen Reactions  . Pork-Derived Products Anaphylaxis  . Azithromycin     Other reaction(s): Other (See Comments)  Pt states that he has hiccups when he takes this medication  . Ibuprofen     ROS Review of Systems  Constitutional: Positive for unexpected weight change (weight loss). Appetite change: Appetite  has improved.  HENT: Negative.   Eyes: Negative.   Respiratory: Negative.  Negative for shortness of breath.   Cardiovascular: Negative.  Negative for chest pain.  Gastrointestinal: Negative.        Reports heartburn and belching.  Endocrine: Negative.   Genitourinary: Negative.   Musculoskeletal: Negative.   Skin: Negative.   Allergic/Immunologic: Positive for environmental allergies (Improved).  Neurological: Negative.   Hematological: Negative.   Psychiatric/Behavioral: Negative.   All other systems reviewed and are negative.     Objective:    Physical Exam Constitutional:      General: He is not in acute distress.    Appearance: Normal appearance. He is well-developed.     Interventions: Face mask in place.  HENT:     Head: Normocephalic and atraumatic.     Right Ear: Hearing normal.     Left Ear: Hearing normal.     Mouth/Throat:     Mouth: No oral lesions.  Eyes:     Conjunctiva/sclera: Conjunctivae normal.     Pupils: Pupils are equal, round, and reactive to light.  Cardiovascular:     Rate and Rhythm: Normal rate and regular rhythm.     Heart sounds: Normal heart sounds. No murmur heard.  No friction rub. No gallop.   Pulmonary:     Effort: Pulmonary effort is normal.     Breath sounds: Normal breath sounds. No wheezing, rhonchi or rales.  Abdominal:     General: Bowel sounds are normal.     Palpations: Abdomen is soft. There is no hepatomegaly, splenomegaly or mass.     Tenderness: There is no abdominal tenderness.  Musculoskeletal:        General: No swelling or tenderness. Normal range of motion.     Cervical back: Normal range of motion and neck supple.     Right lower leg: No edema.     Left lower leg: No edema.  Lymphadenopathy:     Cervical: No cervical adenopathy.     Right cervical: No superficial cervical adenopathy. Skin:    General: Skin is warm and dry.     Findings: No bruising, erythema, lesion or rash.  Neurological:     Mental Status:  He is alert and oriented to person, place, and time.  Psychiatric:        Behavior: Behavior normal.        Thought Content: Thought content normal.        Judgment: Judgment normal.     BP 98/67   Pulse 62   Ht 5\' 4"  (1.626 m)   Wt 113 lb (  51.3 kg)   BMI 19.40 kg/m  Wt Readings from Last 3 Encounters:  08/19/19 113 lb (51.3 kg)  07/02/19 100 lb (45.4 kg)  12/18/18 117 lb (53.1 kg)     Health Maintenance Due  Topic Date Due  . HEMOGLOBIN A1C  Never done  . Hepatitis C Screening  Never done  . FOOT EXAM  Never done  . OPHTHALMOLOGY EXAM  Never done  . COVID-19 Vaccine (1) Never done  . PNA vac Low Risk Adult (1 of 2 - PCV13) 06/17/2015    There are no preventive care reminders to display for this patient.  Lab Results  Component Value Date   TSH 1.690 06/13/2016   Lab Results  Component Value Date   WBC 8.4 09/29/2016   HGB 13.5 09/29/2016   HCT 39.9 09/29/2016   MCV 83 09/29/2016   PLT 384 (H) 09/29/2016   Lab Results  Component Value Date   NA 138 09/29/2016   K 4.4 09/29/2016   CO2 26 09/29/2016   GLUCOSE 154 (H) 09/29/2016   BUN 14 09/29/2016   CREATININE 1.70 (H) 06/25/2019   CALCIUM 9.7 09/29/2016   ANIONGAP 5 08/31/2016   No results found for: CHOL No results found for: HDL No results found for: LDLCALC No results found for: TRIG No results found for: CHOLHDL No results found for: HGBA1C    Assessment & Plan:   Problem List Items Addressed This Visit      Cardiovascular and Mediastinum   HTN (hypertension)    - Today, the patient's blood pressure is well managed on present med. - The patient will continue the current treatment regimen.  - I encouraged the patient to eat a low-sodium diet to help control blood pressure. - I encouraged the patient to live an active lifestyle and complete activities that increases heart rate to 85% target heart rate at least 5 times per week for one hour.            Digestive   GERD (gastroesophageal  reflux disease)    - The patient's GERD is stable on medication.  - Instructed the patient to avoid eating spicy and acidic foods, as well as foods high in fat. - Instructed the patient to avoid eating large meals or meals 2-3 hours prior to sleeping.        Endocrine   Diabetes ([place]) - Primary   Relevant Orders   POCT glucose (manual entry) (Completed)     Other   Left renal mass    And has left renal carcinoma removed patient has an MRI of the prostate which is unremarkable.      Urinary frequency    Urinary frequency is better now         No orders of the defined types were placed in this encounter.   Follow-up: Return in about 3 months (around 11/19/2019).   By signing my name below, I, De Burrs, attest that this documentation has been prepared under the direction and in the presence of Cletis Athens, MD. Electronically Signed: De Burrs, Medical Scribe. 08/19/19. 12:43 PM. I personally performed the services described in this documentation, which was SCRIBED in my presence. The recorded information has been reviewed and considered accurate. It has been edited as necessary during review. Cletis Athens, MD

## 2019-08-21 ENCOUNTER — Other Ambulatory Visit: Payer: Self-pay

## 2019-08-21 DIAGNOSIS — R972 Elevated prostate specific antigen [PSA]: Secondary | ICD-10-CM

## 2019-08-22 ENCOUNTER — Other Ambulatory Visit: Payer: Medicare HMO

## 2019-08-22 ENCOUNTER — Other Ambulatory Visit: Payer: Self-pay

## 2019-08-22 DIAGNOSIS — R972 Elevated prostate specific antigen [PSA]: Secondary | ICD-10-CM

## 2019-08-23 LAB — PSA: Prostate Specific Ag, Serum: 10.6 ng/mL — ABNORMAL HIGH (ref 0.0–4.0)

## 2019-08-26 NOTE — Progress Notes (Signed)
08/27/2019 10:57 AM   Ricky Jarvis May 15, 1950 [id number]  Referring provider: Theotis Burrow, MD [address] [place],  [place] [address] Chief Complaint  Patient presents with   Results    MRI    HPI: Ricky Jarvis is a 69 y.o.  M with elevated/rising PSA and history of bilateral renal cell carcinoma status post left robotic partial nephrectomy and right nephrectomy returns today for review of MRI.   He underwent prostate bx on 12/18/18 which was negative the setting of rising PSA, please see previous notes for details.  PSA was 11.3 on 06/30/19. Most recent PSA was 10.6 on 08/22/2019.    Prostate MRI on 08/06/2019 revealed no radiographic evidence of high-grade prostate carcinoma. PI-RADS 2: Low (clinically significant cancer is unlikely to be present).   Personal history of bilateral RCC, status post left robotic partial nephrectomy 08/2016 and more recently right robotic nephrectomy 05/2017, P T3a.  He returns today with serial surveillance imaging.  He did not get his chest x-ray today.    Most recently CT A/P w/ contrast from 06/25/19 revealed stable postsurgical change from partial nephrectomy involving theinferior pole of the left kidney. No specific findings identified to suggest residual or recurrence of tumor. Also noted enlarged prostate gland. No recent Chest x-ray.   Creatinine 1.70 as of 06/25/19.    Today he is doing well.    PMH: Past Medical History:  Diagnosis Date   Arthritis    Cancer ([place])    Diabetes ([place]) 03/26/2013   GERD (gastroesophageal reflux disease) 03/26/2013   Gout    History of hematuria    HTN (hypertension) 03/26/2013    Surgical History: Past Surgical History:  Procedure Laterality Date   APPENDECTOMY     COLONOSCOPY WITH PROPOFOL N/A 11/18/2018   Procedure: COLONOSCOPY WITH PROPOFOL;  Surgeon: Lin Landsman, MD;  Location: [facility] ENDOSCOPY;  Service: Gastroenterology;  Laterality: N/A;   ROBOTIC ASSITED  PARTIAL NEPHRECTOMY Left 08/29/2016   Procedure: ROBOTIC ASSITED PARTIAL NEPHRECTOMY;  Surgeon: Hollice Espy, MD;  Location: [facility] ORS;  Service: Urology;  Laterality: Left;   UPPER GI ENDOSCOPY      Home Medications:  Allergies as of 08/27/2019      Reactions   Pork-derived Products Anaphylaxis   Azithromycin    Other reaction(s): Other (See Comments)  Pt states that he has hiccups when he takes this medication   Ibuprofen       Medication List       Accurate as of August 27, 2019 10:57 AM. If you have any questions, ask your nurse or doctor.        amLODipine 5 MG tablet Commonly known as: NORVASC TAKE 1 TABLET BY MOUTH DAILY   benazepril 20 MG tablet Commonly known as: LOTENSIN Take 20 mg by mouth at bedtime.   cholecalciferol 1000 units tablet Commonly known as: VITAMIN D Take 1,000 Units by mouth daily.   Janumet 50-500 MG tablet Generic drug: sitaGLIPtin-metformin TAKE 1 TABLET BY MOUTH TWICE DAILY   lovastatin 20 MG tablet Commonly known as: MEVACOR Take 20 mg by mouth at bedtime.   omeprazole 20 MG capsule Commonly known as: PRILOSEC Take by mouth.   rosuvastatin 20 MG tablet Commonly known as: CRESTOR       Allergies:  Allergies  Allergen Reactions   Pork-Derived Products Anaphylaxis   Azithromycin     Other reaction(s): Other (See Comments)  Pt states that he has hiccups when he takes this medication  Ibuprofen     Family History: Family History  Problem Relation Age of Onset   Kidney cancer Mother    Prostate cancer Neg Hx    Bladder Cancer Neg Hx     Social History:  reports that he has never smoked. He has never used smokeless tobacco. He reports that he does not drink alcohol and does not use drugs.   Physical Exam: BP 118/68    Pulse 80    Wt 116 lb (52.6 kg)    BMI 19.91 kg/m   Constitutional:  Alert and oriented, No acute distress.  Accompanied by wife today. HEENT: [place] AT, moist mucus membranes.  Trachea midline, no  masses. Cardiovascular: No clubbing, cyanosis, or edema. Respiratory: Normal respiratory effort, no increased work of breathing. Skin: No rashes, bruises or suspicious lesions. Neurologic: Grossly intact, no focal deficits, moving all 4 extremities. Psychiatric: Normal mood and affect.  Laboratory Data:  Lab Results  Component Value Date   CREATININE 1.70 (H) 06/25/2019    Pertinent Imaging: CLINICAL DATA:  Elevated PSA.  EXAM: MR PROSTATE WITHOUT AND WITH CONTRAST  TECHNIQUE: Multiplanar multisequence MRI images were obtained of the pelvis centered about the prostate. Pre and post contrast images were obtained.  CONTRAST:  42mL GADAVIST GADOBUTROL 1 MMOL/ML IV SOLN  COMPARISON:  None.  FINDINGS: Prostate:  -- Peripheral Zone: Diffuse thinning is seen due to central gland hypertrophy. Linear/wedge shaped hypointensity is seen on ADC; however, no focal ADC hypointense or high b-value DWI hyperintense nodules are identified.  -- Transition/Central Zone: Circumscribed BPH nodules are noted, but no suspicious nodules with obscured or non-circumscribed margins seen.  -- Measurements/Volume:  4.8 x 4.1 x 5.1 cm (volume = 53 cm^3)  Transcapsular spread:  Absent  Seminal vesicle involvement:  Absent  Neurovascular bundle involvement:  Absent  Pelvic adenopathy: None visualized  Bone metastasis: None visualized  Other: Mild diffuse bladder wall thickening, consistent with chronic bladder outlet obstruction.  IMPRESSION: No radiographic evidence of high-grade prostate carcinoma. PI-RADS 2: Low (clinically significant cancer is unlikely to be present)   Electronically Signed   By: Marlaine Hind M.D.   On: 08/06/2019 15:02   I have personally reviewed the images and agree with radiologist interpretation.     Assessment & Plan:    1. Clear cell renal cell carcinoma, left and right CXR and CT chest, abdomen and pelvis are scheduled for  12/2019. Currently NED  2. Elevated PSA PSA is trending down which is reassuring Most recent PSA was 10.6 on 08/22/2019. No evidence of disease on biopsy. Prostate MRI is adequately reassuring- reviewed with patient and wife today extenstively Continue PSA every 6 months.   Return for follow up in 12/2019.  [facility] [address], [place], [place] [address] [phone]  I, Selena Batten, am acting as a scribe for Dr. Hollice Espy.  I have reviewed the above documentation for accuracy and completeness, and I agree with the above.   Hollice Espy, MD

## 2019-08-27 ENCOUNTER — Encounter: Payer: Self-pay | Admitting: Urology

## 2019-08-27 ENCOUNTER — Ambulatory Visit (INDEPENDENT_AMBULATORY_CARE_PROVIDER_SITE_OTHER): Payer: Medicare HMO | Admitting: Urology

## 2019-08-27 ENCOUNTER — Other Ambulatory Visit: Payer: Self-pay

## 2019-08-27 VITALS — BP 118/68 | HR 80 | Wt 116.0 lb

## 2019-08-27 DIAGNOSIS — C642 Malignant neoplasm of left kidney, except renal pelvis: Secondary | ICD-10-CM

## 2019-08-27 DIAGNOSIS — I10 Essential (primary) hypertension: Secondary | ICD-10-CM | POA: Diagnosis not present

## 2019-08-27 DIAGNOSIS — R972 Elevated prostate specific antigen [PSA]: Secondary | ICD-10-CM

## 2019-08-27 DIAGNOSIS — E119 Type 2 diabetes mellitus without complications: Secondary | ICD-10-CM | POA: Diagnosis not present

## 2019-09-16 DIAGNOSIS — I10 Essential (primary) hypertension: Secondary | ICD-10-CM | POA: Diagnosis not present

## 2019-09-16 DIAGNOSIS — E785 Hyperlipidemia, unspecified: Secondary | ICD-10-CM | POA: Diagnosis not present

## 2019-09-19 DIAGNOSIS — I10 Essential (primary) hypertension: Secondary | ICD-10-CM | POA: Diagnosis not present

## 2019-09-19 DIAGNOSIS — D631 Anemia in chronic kidney disease: Secondary | ICD-10-CM | POA: Diagnosis not present

## 2019-09-19 DIAGNOSIS — R809 Proteinuria, unspecified: Secondary | ICD-10-CM | POA: Diagnosis not present

## 2019-09-19 DIAGNOSIS — N1832 Chronic kidney disease, stage 3b: Secondary | ICD-10-CM | POA: Diagnosis not present

## 2019-09-29 ENCOUNTER — Telehealth: Payer: Self-pay | Admitting: Gastroenterology

## 2019-09-29 ENCOUNTER — Inpatient Hospital Stay: Payer: Medicare HMO | Attending: Oncology | Admitting: Oncology

## 2019-09-29 ENCOUNTER — Other Ambulatory Visit: Payer: Self-pay

## 2019-09-29 ENCOUNTER — Inpatient Hospital Stay: Payer: Medicare HMO

## 2019-09-29 ENCOUNTER — Encounter: Payer: Self-pay | Admitting: Oncology

## 2019-09-29 VITALS — BP 125/72 | HR 75 | Temp 96.1°F | Resp 16 | Wt 112.1 lb

## 2019-09-29 DIAGNOSIS — E785 Hyperlipidemia, unspecified: Secondary | ICD-10-CM | POA: Insufficient documentation

## 2019-09-29 DIAGNOSIS — R634 Abnormal weight loss: Secondary | ICD-10-CM | POA: Insufficient documentation

## 2019-09-29 DIAGNOSIS — N189 Chronic kidney disease, unspecified: Secondary | ICD-10-CM | POA: Diagnosis not present

## 2019-09-29 DIAGNOSIS — D509 Iron deficiency anemia, unspecified: Secondary | ICD-10-CM

## 2019-09-29 DIAGNOSIS — R5383 Other fatigue: Secondary | ICD-10-CM | POA: Diagnosis not present

## 2019-09-29 DIAGNOSIS — I129 Hypertensive chronic kidney disease with stage 1 through stage 4 chronic kidney disease, or unspecified chronic kidney disease: Secondary | ICD-10-CM | POA: Diagnosis not present

## 2019-09-29 LAB — CBC WITH DIFFERENTIAL/PLATELET
Abs Immature Granulocytes: 0.01 10*3/uL (ref 0.00–0.07)
Basophils Absolute: 0.1 10*3/uL (ref 0.0–0.1)
Basophils Relative: 1 %
Eosinophils Absolute: 0.5 10*3/uL (ref 0.0–0.5)
Eosinophils Relative: 7 %
HCT: 37.3 % — ABNORMAL LOW (ref 39.0–52.0)
Hemoglobin: 11.7 g/dL — ABNORMAL LOW (ref 13.0–17.0)
Immature Granulocytes: 0 %
Lymphocytes Relative: 18 %
Lymphs Abs: 1.4 10*3/uL (ref 0.7–4.0)
MCH: 23.7 pg — ABNORMAL LOW (ref 26.0–34.0)
MCHC: 31.4 g/dL (ref 30.0–36.0)
MCV: 75.7 fL — ABNORMAL LOW (ref 80.0–100.0)
Monocytes Absolute: 0.6 10*3/uL (ref 0.1–1.0)
Monocytes Relative: 7 %
Neutro Abs: 5.4 10*3/uL (ref 1.7–7.7)
Neutrophils Relative %: 67 %
Platelets: 361 10*3/uL (ref 150–400)
RBC: 4.93 MIL/uL (ref 4.22–5.81)
RDW: 17 % — ABNORMAL HIGH (ref 11.5–15.5)
WBC: 7.9 10*3/uL (ref 4.0–10.5)
nRBC: 0 % (ref 0.0–0.2)

## 2019-09-29 LAB — IRON AND TIBC
Iron: 23 ug/dL — ABNORMAL LOW (ref 45–182)
Saturation Ratios: 5 % — ABNORMAL LOW (ref 17.9–39.5)
TIBC: 514 ug/dL — ABNORMAL HIGH (ref 250–450)
UIBC: 491 ug/dL

## 2019-09-29 LAB — COMPREHENSIVE METABOLIC PANEL
ALT: 13 U/L (ref 0–44)
AST: 13 U/L — ABNORMAL LOW (ref 15–41)
Albumin: 4.1 g/dL (ref 3.5–5.0)
Alkaline Phosphatase: 54 U/L (ref 38–126)
Anion gap: 8 (ref 5–15)
BUN: 32 mg/dL — ABNORMAL HIGH (ref 8–23)
CO2: 23 mmol/L (ref 22–32)
Calcium: 8.8 mg/dL — ABNORMAL LOW (ref 8.9–10.3)
Chloride: 104 mmol/L (ref 98–111)
Creatinine, Ser: 1.56 mg/dL — ABNORMAL HIGH (ref 0.61–1.24)
GFR calc Af Amer: 52 mL/min — ABNORMAL LOW (ref >60)
GFR calc non Af Amer: 45 mL/min — ABNORMAL LOW (ref >60)
Glucose, Bld: 101 mg/dL — ABNORMAL HIGH (ref 70–99)
Potassium: 4.6 mmol/L (ref 3.5–5.1)
Sodium: 135 mmol/L (ref 135–145)
Total Bilirubin: 0.6 mg/dL (ref 0.3–1.2)
Total Protein: 8.3 g/dL — ABNORMAL HIGH (ref 6.5–8.1)

## 2019-09-29 LAB — VITAMIN B12: Vitamin B-12: 109 pg/mL — ABNORMAL LOW (ref 180–914)

## 2019-09-29 LAB — FOLATE: Folate: 13.6 ng/mL (ref >5.9)

## 2019-09-29 LAB — RETICULOCYTES
Immature Retic Fract: 14.7 % (ref 2.3–15.9)
RBC.: 4.88 MIL/uL (ref 4.22–5.81)
Retic Count, Absolute: 41.5 10*3/uL (ref 19.0–186.0)
Retic Ct Pct: 0.9 % (ref 0.4–3.1)

## 2019-09-29 LAB — FERRITIN: Ferritin: 7 ng/mL — ABNORMAL LOW (ref 24–336)

## 2019-09-29 LAB — TSH: TSH: 1.01 u[IU]/mL (ref 0.350–4.500)

## 2019-09-29 NOTE — Telephone Encounter (Signed)
LVM for patient to call our office to schedule an appt with per Dr. Marius Ditch

## 2019-09-30 LAB — HAPTOGLOBIN: Haptoglobin: 324 mg/dL (ref 32–363)

## 2019-10-01 LAB — MULTIPLE MYELOMA PANEL, SERUM
Albumin SerPl Elph-Mcnc: 3.5 g/dL (ref 2.9–4.4)
Albumin/Glob SerPl: 0.9 (ref 0.7–1.7)
Alpha 1: 0.2 g/dL (ref 0.0–0.4)
Alpha2 Glob SerPl Elph-Mcnc: 1.2 g/dL — ABNORMAL HIGH (ref 0.4–1.0)
B-Globulin SerPl Elph-Mcnc: 1.3 g/dL (ref 0.7–1.3)
Gamma Glob SerPl Elph-Mcnc: 1.3 g/dL (ref 0.4–1.8)
Globulin, Total: 4 g/dL — ABNORMAL HIGH (ref 2.2–3.9)
IgA: 404 mg/dL (ref 61–437)
IgG (Immunoglobin G), Serum: 1279 mg/dL (ref 603–1613)
IgM (Immunoglobulin M), Srm: 52 mg/dL (ref 20–172)
Total Protein ELP: 7.5 g/dL (ref 6.0–8.5)

## 2019-10-02 NOTE — Progress Notes (Signed)
Hematology/Oncology Consult note [facility] Telephone:([phone] [phone]  Patient Care Team: Theotis Burrow, MD as PCP - General (Family Medicine)   Name of the patient: Ricky Jarvis  [id number]  [birth year]    Reason for referral-anemia   Referring physician-Dr. Holley Raring  Date of visit: 10/02/19   History of presenting illness-patient is a 69 year old gentleman from [place] who speaks Urdu and limited [place].  I was able to converse with him in Hindi which is a language both of [place] can understand comprehend well. His past medical history significant for hypertension hyperlipidemia and chronic kidney disease.  He was referred for anemia.  Most recent CBC from 09/19/2019 showed white count of 7.4, H&H of 11.3/37.6 with an MCV of 77.8 and a platelet count of 352.  Serum creatinine elevated at 1.5.  Last iron studies checked in March 2021 showed a ferritin level of 8 and elevated TIBC.  He has seen Dr. Drucilla Schmidt in the past and has undergone colonoscopy in October 2020 but has not undergone EGD.  Colonoscopy showed 2 small polyps in the rectum and cecum which were negative for malignancy.  No active bleeding was noted at that time.  Patient currently reports ongoing fatigue.  He has lost 4 pounds in the last 1 month.  He continues to be active and works 40 hours a week.  Denies any blood loss in his stool or urine.  Denies any dark melanotic stools.  Denies any consistent use of NSAIDs.  ECOG PS- 1  Pain scale- 0   Review of systems- Review of Systems  Constitutional: Positive for malaise/fatigue and weight loss. Negative for chills and fever.  HENT: Negative for congestion, ear discharge and nosebleeds.   Eyes: Negative for blurred vision.  Respiratory: Negative for cough, hemoptysis, sputum production, shortness of breath and wheezing.   Cardiovascular: Negative for chest pain, palpitations, orthopnea and claudication.  Gastrointestinal:  Negative for abdominal pain, blood in stool, constipation, diarrhea, heartburn, melena, nausea and vomiting.  Genitourinary: Negative for dysuria, flank pain, frequency, hematuria and urgency.  Musculoskeletal: Negative for back pain, joint pain and myalgias.  Skin: Negative for rash.  Neurological: Negative for dizziness, tingling, focal weakness, seizures, weakness and headaches.  Endo/Heme/Allergies: Does not bruise/bleed easily.  Psychiatric/Behavioral: Negative for depression and suicidal ideas. The patient does not have insomnia.     Allergies  Allergen Reactions  . Pork-Derived Products Anaphylaxis  . Azithromycin     Other reaction(s): Other (See Comments)  Pt states that he has hiccups when he takes this medication  . Ibuprofen     Patient Active Problem List   Diagnosis Date Noted  . Urinary frequency 09/01/2016  . Left renal mass 08/29/2016  . Diabetes ([place]) 03/26/2013  . GERD (gastroesophageal reflux disease) 03/26/2013  . Hiccups 03/26/2013  . HTN (hypertension) 03/26/2013     Past Medical History:  Diagnosis Date  . Arthritis   . Cancer ([place])   . Diabetes ([place]) 03/26/2013  . GERD (gastroesophageal reflux disease) 03/26/2013  . Gout   . History of hematuria   . HTN (hypertension) 03/26/2013     Past Surgical History:  Procedure Laterality Date  . APPENDECTOMY    . COLONOSCOPY WITH PROPOFOL N/A 11/18/2018   Procedure: COLONOSCOPY WITH PROPOFOL;  Surgeon: Lin Landsman, MD;  Location: [facility] ENDOSCOPY;  Service: Gastroenterology;  Laterality: N/A;  . ROBOTIC ASSITED PARTIAL NEPHRECTOMY Left 08/29/2016   Procedure: ROBOTIC ASSITED PARTIAL NEPHRECTOMY;  Surgeon: Hollice Espy, MD;  Location: [facility]  ORS;  Service: Urology;  Laterality: Left;  . UPPER GI ENDOSCOPY      Social History   Socioeconomic History  . Marital status: Married    Spouse name: Not on file  . Number of children: Not on file  . Years of education: Not on file  . Highest education  level: Not on file  Occupational History  . Not on file  Tobacco Use  . Smoking status: Never Smoker  . Smokeless tobacco: Never Used  Vaping Use  . Vaping Use: Never used  Substance and Sexual Activity  . Alcohol use: No  . Drug use: No  . Sexual activity: Not on file  Other Topics Concern  . Not on file  Social History Narrative  . Not on file   Social Determinants of Health   Financial Resource Strain:   . Difficulty of Paying Living Expenses: Not on file  Food Insecurity:   . Worried About Charity fundraiser in the Last Year: Not on file  . Ran Out of Food in the Last Year: Not on file  Transportation Needs:   . Lack of Transportation (Medical): Not on file  . Lack of Transportation (Non-Medical): Not on file  Physical Activity:   . Days of Exercise per Week: Not on file  . Minutes of Exercise per Session: Not on file  Stress:   . Feeling of Stress : Not on file  Social Connections:   . Frequency of Communication with Friends and Family: Not on file  . Frequency of Social Gatherings with Friends and Family: Not on file  . Attends Religious Services: Not on file  . Active Member of Clubs or Organizations: Not on file  . Attends Archivist Meetings: Not on file  . Marital Status: Not on file  Intimate Partner Violence:   . Fear of Current or Ex-Partner: Not on file  . Emotionally Abused: Not on file  . Physically Abused: Not on file  . Sexually Abused: Not on file     Family History  Problem Relation Age of Onset  . Kidney cancer Mother   . Prostate cancer Neg Hx   . Bladder Cancer Neg Hx      Current Outpatient Medications:  .  amLODipine (NORVASC) 5 MG tablet, TAKE 1 TABLET BY MOUTH DAILY, Disp: 90 tablet, Rfl: 3 .  benazepril (LOTENSIN) 20 MG tablet, Take 20 mg by mouth at bedtime. , Disp: , Rfl:  .  cholecalciferol (VITAMIN D) 1000 units tablet, Take 1,000 Units by mouth daily., Disp: , Rfl:  .  JANUMET 50-500 MG tablet, TAKE 1 TABLET BY  MOUTH TWICE DAILY, Disp: 180 tablet, Rfl: 3 .  lovastatin (MEVACOR) 20 MG tablet, Take 20 mg by mouth at bedtime. , Disp: , Rfl:  .  omeprazole (PRILOSEC) 20 MG capsule, Take by mouth., Disp: , Rfl:  .  rosuvastatin (CRESTOR) 20 MG tablet, , Disp: , Rfl:    Physical exam:  Vitals:   09/29/19 1114  BP: 125/72  Pulse: 75  Resp: 16  Temp: (!) 96.1 F (35.6 C)  TempSrc: Tympanic  SpO2: 100%  Weight: 112 lb 1.6 oz (50.8 kg)   Physical Exam Constitutional:      Comments: Thin elderly gentleman in no acute distress  Cardiovascular:     Rate and Rhythm: Normal rate and regular rhythm.     Heart sounds: Normal heart sounds.  Pulmonary:     Effort: Pulmonary effort is normal.  Breath sounds: Normal breath sounds.  Abdominal:     General: Bowel sounds are normal.     Palpations: Abdomen is soft.     Comments: No palpable hepatosplenomegaly  Lymphadenopathy:     Comments: No palpable cervical, supraclavicular, axillary or inguinal adenopathy   Skin:    General: Skin is warm and dry.  Neurological:     Mental Status: He is alert and oriented to person, place, and time.        CMP Latest Ref Rng & Units 09/29/2019  Glucose 70 - 99 mg/dL 101(H)  BUN 8 - 23 mg/dL 32(H)  Creatinine 0.61 - 1.24 mg/dL 1.56(H)  Sodium 135 - 145 mmol/L 135  Potassium 3.5 - 5.1 mmol/L 4.6  Chloride 98 - 111 mmol/L 104  CO2 22 - 32 mmol/L 23  Calcium 8.9 - 10.3 mg/dL 8.8(L)  Total Protein 6.5 - 8.1 g/dL 8.3(H)  Total Bilirubin 0.3 - 1.2 mg/dL 0.6  Alkaline Phos 38 - 126 U/L 54  AST 15 - 41 U/L 13(L)  ALT 0 - 44 U/L 13   CBC Latest Ref Rng & Units 09/29/2019  WBC 4.0 - 10.5 K/uL 7.9  Hemoglobin 13.0 - 17.0 g/dL 11.7(L)  Hematocrit 39 - 52 % 37.3(L)  Platelets 150 - 400 K/uL 361    Assessment and plan- Patient is a 69 y.o. male referred for normocytic anemia  Suspect his anemia is secondary to iron deficiency with some component of anemia of chronic kidney disease as well.Today I will do a  complete anemia work-up including CBC with differential, CMP, ferritin and iron studies, B12 and folate, myeloma panel, reticulocyte count, haptoglobin and TSH.  I will see the patient back in 2 weeks time to discuss the results of his blood work.  If he is iron deficient options include both IV or oral iron since he is not significantly anemic.  Patient and his wife comprehend my plan well.  Also there is evidence of iron deficiency I will refer him back to Dr. Marius Ditch for further evaluation   Thank you for this kind referral and the opportunity to participate in the care of this patient   Visit Diagnosis 1. Microcytic anemia   2. Iron deficiency anemia, unspecified iron deficiency anemia type     Dr. Randa Evens, MD, MPH [facility] [phone] 10/02/2019

## 2019-10-04 ENCOUNTER — Other Ambulatory Visit: Payer: Self-pay | Admitting: Internal Medicine

## 2019-10-06 ENCOUNTER — Other Ambulatory Visit: Payer: Self-pay | Admitting: Oncology

## 2019-10-06 DIAGNOSIS — D509 Iron deficiency anemia, unspecified: Secondary | ICD-10-CM | POA: Insufficient documentation

## 2019-10-06 DIAGNOSIS — D508 Other iron deficiency anemias: Secondary | ICD-10-CM

## 2019-10-09 ENCOUNTER — Inpatient Hospital Stay: Payer: Medicare HMO | Attending: Oncology | Admitting: Oncology

## 2019-10-09 ENCOUNTER — Encounter: Payer: Self-pay | Admitting: Oncology

## 2019-10-09 ENCOUNTER — Other Ambulatory Visit: Payer: Self-pay

## 2019-10-09 ENCOUNTER — Inpatient Hospital Stay: Payer: Medicare HMO

## 2019-10-09 VITALS — BP 128/72 | HR 76 | Temp 98.0°F | Resp 16 | Ht 64.0 in | Wt 112.5 lb

## 2019-10-09 DIAGNOSIS — D508 Other iron deficiency anemias: Secondary | ICD-10-CM

## 2019-10-09 DIAGNOSIS — D509 Iron deficiency anemia, unspecified: Secondary | ICD-10-CM

## 2019-10-09 DIAGNOSIS — E538 Deficiency of other specified B group vitamins: Secondary | ICD-10-CM | POA: Diagnosis not present

## 2019-10-09 MED ORDER — SODIUM CHLORIDE 0.9 % IV SOLN: 200.0000 mg | INTRAVENOUS | Status: DC

## 2019-10-09 MED ORDER — IRON SUCROSE 20 MG/ML IV SOLN
200.0000 mg | Freq: Once | INTRAVENOUS | Status: AC
  Administered 2019-10-09: 200 mg via INTRAVENOUS
  Filled 2019-10-09: qty 10

## 2019-10-09 MED ORDER — SODIUM CHLORIDE 0.9 % IV SOLN
Freq: Once | INTRAVENOUS | Status: AC
  Filled 2019-10-09: qty 250

## 2019-10-09 NOTE — Progress Notes (Signed)
Pt tolerated infusion well. No s/s of distress or reaction noted. Pt and VS stable at discharge.  

## 2019-10-09 NOTE — Progress Notes (Signed)
Pt states he is here to get lab  Results, he is eating good and drinking good.Marland Kitchen good BM. Sleeps well. No concerns

## 2019-10-09 NOTE — Progress Notes (Signed)
Hematology/Oncology Consult note [facility]  Telephone:([phone] [phone]  Patient Care Team: Theotis Burrow, MD as PCP - General (Family Medicine)   Name of the patient: Ricky Jarvis  [id number]  18-Sep-1950   Date of visit: 10/09/19  Diagnosis- normocytic anemia likely a combination of iron and B12 deficiency as well as anemia of chronic kidney disease  Chief complaint/ Reason for visit- discuss results of blood work  Heme/Onc history: patient is a 69 year old gentleman from [place] who speaks Urdu and limited [place].  I was able to converse with him in Hindi which is a language both of [place] can understand comprehend well. His past medical history significant for hypertension hyperlipidemia and chronic kidney disease.  He was referred for anemia.  Most recent CBC from 09/19/2019 showed white count of 7.4, H&H of 11.3/37.6 with an MCV of 77.8 and a platelet count of 352.  Serum creatinine elevated at 1.5.  Last iron studies checked in March 2021 showed a ferritin level of 8 and elevated TIBC.  He has seen Dr. Drucilla Schmidt in the past and has undergone colonoscopy in October 2020 but has not undergone EGD.  Colonoscopy showed 2 small polyps in the rectum and cecum which were negative for malignancy.  No active bleeding was noted at that time.  Patient currently reports ongoing fatigue.  He has lost 4 pounds in the last 1 month.  He continues to be active and works 40 hours a week.    Results of blood work from 09/29/2019 were as follows: CBC showed white count of 7.9, H&H of 11.7/37.3 with an MCV of 75.7 and a platelet count of 361.  CMP was significant for elevated creatinine of 1.5.  Folate levels were normal.  B12 level was normal at 109.  Ferritin level was low at 7 with iron studies showing elevated TIBC.  Myeloma panel showed polyclonal increase in immunoglobulins.  Haptoglobin and TSH was normal.   Interval history- patient reports fatigue.  Appetite is fair. He is concerned about ongoing weight loss  ECOG PS- 1 Pain scale- 0   Review of systems- Review of Systems  Constitutional: Positive for malaise/fatigue and weight loss. Negative for chills and fever.  HENT: Negative for congestion, ear discharge and nosebleeds.   Eyes: Negative for blurred vision.  Respiratory: Negative for cough, hemoptysis, sputum production, shortness of breath and wheezing.   Cardiovascular: Negative for chest pain, palpitations, orthopnea and claudication.  Gastrointestinal: Negative for abdominal pain, blood in stool, constipation, diarrhea, heartburn, melena, nausea and vomiting.  Genitourinary: Negative for dysuria, flank pain, frequency, hematuria and urgency.  Musculoskeletal: Negative for back pain, joint pain and myalgias.  Skin: Negative for rash.  Neurological: Negative for dizziness, tingling, focal weakness, seizures, weakness and headaches.  Endo/Heme/Allergies: Does not bruise/bleed easily.  Psychiatric/Behavioral: Negative for depression and suicidal ideas. The patient does not have insomnia.       Allergies  Allergen Reactions  . Pork-Derived Products Anaphylaxis  . Azithromycin     Other reaction(s): Other (See Comments)  Pt states that he has hiccups when he takes this medication  . Ibuprofen      Past Medical History:  Diagnosis Date  . Anemia   . Arthritis   . Cancer ([place])   . Diabetes ([place]) 03/26/2013  . GERD (gastroesophageal reflux disease) 03/26/2013  . Gout   . History of hematuria   . HTN (hypertension) 03/26/2013     Past Surgical History:  Procedure Laterality Date  .  APPENDECTOMY    . COLONOSCOPY WITH PROPOFOL N/A 11/18/2018   Procedure: COLONOSCOPY WITH PROPOFOL;  Surgeon: Lin Landsman, MD;  Location: [facility] ENDOSCOPY;  Service: Gastroenterology;  Laterality: N/A;  . ROBOTIC ASSITED PARTIAL NEPHRECTOMY Left 08/29/2016   Procedure: ROBOTIC ASSITED PARTIAL NEPHRECTOMY;  Surgeon: Hollice Espy,  MD;  Location: [facility] ORS;  Service: Urology;  Laterality: Left;  . UPPER GI ENDOSCOPY      Social History   Socioeconomic History  . Marital status: Married    Spouse name: Not on file  . Number of children: Not on file  . Years of education: Not on file  . Highest education level: Not on file  Occupational History  . Not on file  Tobacco Use  . Smoking status: Never Smoker  . Smokeless tobacco: Never Used  Vaping Use  . Vaping Use: Never used  Substance and Sexual Activity  . Alcohol use: No  . Drug use: No  . Sexual activity: Not Currently  Other Topics Concern  . Not on file  Social History Narrative  . Not on file   Social Determinants of Health   Financial Resource Strain:   . Difficulty of Paying Living Expenses: Not on file  Food Insecurity:   . Worried About Charity fundraiser in the Last Year: Not on file  . Ran Out of Food in the Last Year: Not on file  Transportation Needs:   . Lack of Transportation (Medical): Not on file  . Lack of Transportation (Non-Medical): Not on file  Physical Activity:   . Days of Exercise per Week: Not on file  . Minutes of Exercise per Session: Not on file  Stress:   . Feeling of Stress : Not on file  Social Connections:   . Frequency of Communication with Friends and Family: Not on file  . Frequency of Social Gatherings with Friends and Family: Not on file  . Attends Religious Services: Not on file  . Active Member of Clubs or Organizations: Not on file  . Attends Archivist Meetings: Not on file  . Marital Status: Not on file  Intimate Partner Violence:   . Fear of Current or Ex-Partner: Not on file  . Emotionally Abused: Not on file  . Physically Abused: Not on file  . Sexually Abused: Not on file    Family History  Problem Relation Age of Onset  . Kidney cancer Mother   . Prostate cancer Neg Hx   . Bladder Cancer Neg Hx      Current Outpatient Medications:  .  amLODipine (NORVASC) 5 MG tablet, TAKE  1 TABLET BY MOUTH DAILY, Disp: 90 tablet, Rfl: 3 .  benazepril (LOTENSIN) 20 MG tablet, Take 20 mg by mouth at bedtime. , Disp: , Rfl:  .  cholecalciferol (VITAMIN D) 1000 units tablet, Take 1,000 Units by mouth daily., Disp: , Rfl:  .  JANUMET 50-500 MG tablet, TAKE 1 TABLET BY MOUTH TWICE DAILY, Disp: 180 tablet, Rfl: 3 .  lovastatin (MEVACOR) 20 MG tablet, Take 20 mg by mouth at bedtime. , Disp: , Rfl:  .  omeprazole (PRILOSEC) 20 MG capsule, Take by mouth., Disp: , Rfl:  .  rosuvastatin (CRESTOR) 20 MG tablet, Take 1 tablet by mouth once daily, Disp: 90 tablet, Rfl: 0  Physical exam:  Vitals:   10/09/19 0938  BP: 128/72  Pulse: 76  Resp: 16  Temp: 98 F (36.7 C)  TempSrc: Oral  Weight: 112 lb 8 oz (51 kg)  Height: 5\' 4"  (1.626 m)   Physical Exam Constitutional:      General: He is not in acute distress. Pulmonary:     Effort: Pulmonary effort is normal.  Skin:    General: Skin is warm and dry.  Neurological:     Mental Status: He is alert and oriented to person, place, and time.      CMP Latest Ref Rng & Units 09/29/2019  Glucose 70 - 99 mg/dL 101(H)  BUN 8 - 23 mg/dL 32(H)  Creatinine 0.61 - 1.24 mg/dL 1.56(H)  Sodium 135 - 145 mmol/L 135  Potassium 3.5 - 5.1 mmol/L 4.6  Chloride 98 - 111 mmol/L 104  CO2 22 - 32 mmol/L 23  Calcium 8.9 - 10.3 mg/dL 8.8(L)  Total Protein 6.5 - 8.1 g/dL 8.3(H)  Total Bilirubin 0.3 - 1.2 mg/dL 0.6  Alkaline Phos 38 - 126 U/L 54  AST 15 - 41 U/L 13(L)  ALT 0 - 44 U/L 13   CBC Latest Ref Rng & Units 09/29/2019  WBC 4.0 - 10.5 K/uL 7.9  Hemoglobin 13.0 - 17.0 g/dL 11.7(L)  Hematocrit 39 - 52 % 37.3(L)  Platelets 150 - 400 K/uL 361      Assessment and plan- Patient is a 69 y.o. male with normocytic anemia likely combination of iron and b12 deficiency and anemia of CKD  Discussed results of bloodwork which was consistent with iron deficiency and B12 deficiency anemia.  There may be an element of anemia of CKD as well.  Given his  ongoing fatigue it would be reasonable to give him a trial of IV iron.  Recommend 5 doses of Venofer 200 mg IV weekly and he will also get 5 doses of B12 shot along with.  I have asked him to start taking oral B12 1000 mcg daily following that.  Repeat CBC ferritin and iron studies as well as B12 after 2 months and I will see him thereafter.  With regards to etiology of iron deficiency anemia: I did touch base with Dr. Marius Ditch and I have also given the patient information about [facility] who he has seen in the past to establish follow-up soon.   Visit Diagnosis 1. Iron deficiency anemia, unspecified iron deficiency anemia type   2. B12 deficiency      Dr. Randa Evens, MD, MPH [facility] [phone] 10/09/2019 12:46 PM

## 2019-10-14 ENCOUNTER — Telehealth: Payer: Self-pay | Admitting: Oncology

## 2019-10-14 NOTE — Telephone Encounter (Signed)
Writer phoned patient on this date and moved patient's iron infusion from 10-20-19 to 10-27-19 to help with room maintenance. Patient is agreeable with changing dates.

## 2019-10-17 ENCOUNTER — Other Ambulatory Visit: Payer: Self-pay

## 2019-10-17 ENCOUNTER — Inpatient Hospital Stay: Payer: Medicare HMO

## 2019-10-17 VITALS — BP 117/62 | HR 66 | Temp 96.9°F | Resp 16

## 2019-10-17 DIAGNOSIS — E538 Deficiency of other specified B group vitamins: Secondary | ICD-10-CM | POA: Diagnosis not present

## 2019-10-17 DIAGNOSIS — D508 Other iron deficiency anemias: Secondary | ICD-10-CM

## 2019-10-17 DIAGNOSIS — D509 Iron deficiency anemia, unspecified: Secondary | ICD-10-CM | POA: Diagnosis not present

## 2019-10-17 MED ORDER — IRON SUCROSE 20 MG/ML IV SOLN
200.0000 mg | Freq: Once | INTRAVENOUS | Status: AC
  Administered 2019-10-17: 200 mg via INTRAVENOUS
  Filled 2019-10-17: qty 10

## 2019-10-17 MED ORDER — CYANOCOBALAMIN 1000 MCG/ML IJ SOLN
1000.0000 ug | INTRAMUSCULAR | Status: DC
  Administered 2019-10-17: 1000 ug via INTRAMUSCULAR
  Filled 2019-10-17: qty 1

## 2019-10-17 MED ORDER — SODIUM CHLORIDE 0.9 % IV SOLN: 200.0000 mg | INTRAVENOUS | Status: DC

## 2019-10-17 MED ORDER — SODIUM CHLORIDE 0.9 % IV SOLN
Freq: Once | INTRAVENOUS | Status: AC
  Filled 2019-10-17: qty 250

## 2019-10-17 NOTE — Progress Notes (Signed)
Patient here for his 2nd dose of IV venofer and B12 injection today. He denies having any nausea, vomiting, diarrhea, shortness of breath, cough or pain. He states that he is feeling well today. Will proceed with IV venofer and B12 injection.

## 2019-10-20 ENCOUNTER — Ambulatory Visit: Payer: Medicare HMO

## 2019-10-22 ENCOUNTER — Inpatient Hospital Stay: Payer: Medicare HMO

## 2019-10-22 ENCOUNTER — Other Ambulatory Visit: Payer: Self-pay

## 2019-10-22 VITALS — BP 111/61 | HR 72 | Temp 98.7°F | Resp 16

## 2019-10-22 DIAGNOSIS — D508 Other iron deficiency anemias: Secondary | ICD-10-CM

## 2019-10-22 DIAGNOSIS — E538 Deficiency of other specified B group vitamins: Secondary | ICD-10-CM | POA: Diagnosis not present

## 2019-10-22 DIAGNOSIS — D509 Iron deficiency anemia, unspecified: Secondary | ICD-10-CM | POA: Diagnosis not present

## 2019-10-22 MED ORDER — IRON SUCROSE 20 MG/ML IV SOLN
200.0000 mg | Freq: Once | INTRAVENOUS | Status: AC
  Administered 2019-10-22: 200 mg via INTRAVENOUS
  Filled 2019-10-22: qty 10

## 2019-10-22 MED ORDER — SODIUM CHLORIDE 0.9 % IV SOLN: 200.0000 mg | INTRAVENOUS | Status: DC

## 2019-10-22 MED ORDER — SODIUM CHLORIDE 0.9 % IV SOLN
Freq: Once | INTRAVENOUS | Status: AC
  Filled 2019-10-22: qty 250

## 2019-10-22 MED ORDER — CYANOCOBALAMIN 1000 MCG/ML IJ SOLN
1000.0000 ug | INTRAMUSCULAR | Status: DC
  Administered 2019-10-22: 1000 ug via INTRAMUSCULAR
  Filled 2019-10-22: qty 1

## 2019-10-24 ENCOUNTER — Inpatient Hospital Stay: Payer: Medicare HMO

## 2019-10-24 ENCOUNTER — Other Ambulatory Visit: Payer: Self-pay

## 2019-10-24 VITALS — BP 114/60 | HR 65 | Temp 98.0°F | Resp 18

## 2019-10-24 DIAGNOSIS — D509 Iron deficiency anemia, unspecified: Secondary | ICD-10-CM | POA: Diagnosis not present

## 2019-10-24 DIAGNOSIS — E538 Deficiency of other specified B group vitamins: Secondary | ICD-10-CM | POA: Diagnosis not present

## 2019-10-24 DIAGNOSIS — D508 Other iron deficiency anemias: Secondary | ICD-10-CM

## 2019-10-24 MED ORDER — IRON SUCROSE 20 MG/ML IV SOLN
200.0000 mg | Freq: Once | INTRAVENOUS | Status: AC
  Administered 2019-10-24: 200 mg via INTRAVENOUS
  Filled 2019-10-24: qty 10

## 2019-10-24 MED ORDER — SODIUM CHLORIDE 0.9 % IV SOLN: 200.0000 mg | INTRAVENOUS | Status: DC

## 2019-10-24 MED ORDER — SODIUM CHLORIDE 0.9 % IV SOLN
Freq: Once | INTRAVENOUS | Status: AC
  Filled 2019-10-24: qty 250

## 2019-10-24 MED ORDER — CYANOCOBALAMIN 1000 MCG/ML IJ SOLN
1000.0000 ug | INTRAMUSCULAR | Status: DC
  Administered 2019-10-24: 1000 ug via INTRAMUSCULAR
  Filled 2019-10-24: qty 1

## 2019-10-27 ENCOUNTER — Inpatient Hospital Stay: Payer: Medicare HMO

## 2019-10-27 ENCOUNTER — Other Ambulatory Visit: Payer: Self-pay

## 2019-10-27 VITALS — BP 108/60 | HR 65 | Temp 96.3°F | Resp 18

## 2019-10-27 DIAGNOSIS — D509 Iron deficiency anemia, unspecified: Secondary | ICD-10-CM | POA: Diagnosis not present

## 2019-10-27 DIAGNOSIS — E538 Deficiency of other specified B group vitamins: Secondary | ICD-10-CM | POA: Diagnosis not present

## 2019-10-27 DIAGNOSIS — D508 Other iron deficiency anemias: Secondary | ICD-10-CM

## 2019-10-27 MED ORDER — SODIUM CHLORIDE 0.9 % IV SOLN
Freq: Once | INTRAVENOUS | Status: AC
  Filled 2019-10-27: qty 250

## 2019-10-27 MED ORDER — SODIUM CHLORIDE 0.9 % IV SOLN: 200.0000 mg | INTRAVENOUS | Status: DC

## 2019-10-27 MED ORDER — IRON SUCROSE 20 MG/ML IV SOLN
200.0000 mg | Freq: Once | INTRAVENOUS | Status: AC
  Administered 2019-10-27: 200 mg via INTRAVENOUS
  Filled 2019-10-27: qty 10

## 2019-10-28 DIAGNOSIS — E119 Type 2 diabetes mellitus without complications: Secondary | ICD-10-CM | POA: Diagnosis not present

## 2019-11-03 ENCOUNTER — Ambulatory Visit (INDEPENDENT_AMBULATORY_CARE_PROVIDER_SITE_OTHER): Payer: Medicare HMO

## 2019-11-03 DIAGNOSIS — J329 Chronic sinusitis, unspecified: Secondary | ICD-10-CM | POA: Diagnosis not present

## 2019-11-03 LAB — POC COVID19 BINAXNOW: SARS Coronavirus 2 Ag: NEGATIVE

## 2019-11-04 DIAGNOSIS — R69 Illness, unspecified: Secondary | ICD-10-CM | POA: Diagnosis not present

## 2019-11-17 DIAGNOSIS — Z20822 Contact with and (suspected) exposure to covid-19: Secondary | ICD-10-CM | POA: Diagnosis not present

## 2019-12-02 DIAGNOSIS — I10 Essential (primary) hypertension: Secondary | ICD-10-CM | POA: Diagnosis not present

## 2019-12-02 DIAGNOSIS — E119 Type 2 diabetes mellitus without complications: Secondary | ICD-10-CM | POA: Diagnosis not present

## 2019-12-09 ENCOUNTER — Ambulatory Visit: Payer: Medicare HMO | Admitting: Oncology

## 2019-12-09 ENCOUNTER — Other Ambulatory Visit: Payer: Medicare HMO

## 2019-12-19 ENCOUNTER — Other Ambulatory Visit: Payer: Self-pay

## 2019-12-19 DIAGNOSIS — R972 Elevated prostate specific antigen [PSA]: Secondary | ICD-10-CM

## 2019-12-22 ENCOUNTER — Other Ambulatory Visit: Payer: Self-pay

## 2019-12-22 ENCOUNTER — Encounter: Payer: Self-pay | Admitting: Urology

## 2019-12-29 ENCOUNTER — Inpatient Hospital Stay: Payer: Medicare HMO

## 2019-12-29 ENCOUNTER — Inpatient Hospital Stay: Payer: Medicare HMO | Admitting: Oncology

## 2019-12-29 NOTE — Progress Notes (Deleted)
12/30/2019 8:43 PM   Ricky Jarvis 1950-12-21 [id number]  Referring provider: Theotis Burrow, MD [address] [place],  [place] [address]  No chief complaint on file.   HPI:    PMH: Past Medical History:  Diagnosis Date  . Anemia   . Arthritis   . Cancer ([place])   . Diabetes ([place]) 03/26/2013  . GERD (gastroesophageal reflux disease) 03/26/2013  . Gout   . History of hematuria   . HTN (hypertension) 03/26/2013    Surgical History: Past Surgical History:  Procedure Laterality Date  . APPENDECTOMY    . COLONOSCOPY WITH PROPOFOL N/A 11/18/2018   Procedure: COLONOSCOPY WITH PROPOFOL;  Surgeon: Lin Landsman, MD;  Location: [facility] ENDOSCOPY;  Service: Gastroenterology;  Laterality: N/A;  . ROBOTIC ASSITED PARTIAL NEPHRECTOMY Left 08/29/2016   Procedure: ROBOTIC ASSITED PARTIAL NEPHRECTOMY;  Surgeon: Hollice Espy, MD;  Location: [facility] ORS;  Service: Urology;  Laterality: Left;  . UPPER GI ENDOSCOPY      Home Medications:  Allergies as of 12/30/2019      Reactions   Pork-derived Products Anaphylaxis   Azithromycin    Other reaction(s): Other (See Comments)  Pt states that he has hiccups when he takes this medication   Ibuprofen       Medication List       Accurate as of December 29, 2019  8:43 PM. If you have any questions, ask your nurse or doctor.        amLODipine 5 MG tablet Commonly known as: NORVASC TAKE 1 TABLET BY MOUTH DAILY   benazepril 20 MG tablet Commonly known as: LOTENSIN Take 20 mg by mouth at bedtime.   cholecalciferol 1000 units tablet Commonly known as: VITAMIN D Take 1,000 Units by mouth daily.   Janumet 50-500 MG tablet Generic drug: sitaGLIPtin-metformin TAKE 1 TABLET BY MOUTH TWICE DAILY   lovastatin 20 MG tablet Commonly known as: MEVACOR Take 20 mg by mouth at bedtime.   omeprazole 20 MG capsule Commonly known as: PRILOSEC Take by mouth.   rosuvastatin 20 MG tablet Commonly known as: CRESTOR Take 1  tablet by mouth once daily       Allergies:  Allergies  Allergen Reactions  . Pork-Derived Products Anaphylaxis  . Azithromycin     Other reaction(s): Other (See Comments)  Pt states that he has hiccups when he takes this medication  . Ibuprofen     Family History: Family History  Problem Relation Age of Onset  . Kidney cancer Mother   . Prostate cancer Neg Hx   . Bladder Cancer Neg Hx     Social History:  reports that he has never smoked. He has never used smokeless tobacco. He reports that he does not drink alcohol and does not use drugs.   Physical Exam: There were no vitals taken for this visit.  Constitutional:  Alert and oriented, No acute distress. HEENT: [place] AT, moist mucus membranes.  Trachea midline, no masses. Cardiovascular: No clubbing, cyanosis, or edema. Respiratory: Normal respiratory effort, no increased work of breathing. GI: Abdomen is soft, nontender, nondistended, no abdominal masses GU: No CVA tenderness Lymph: No cervical or inguinal lymphadenopathy. Skin: No rashes, bruises or suspicious lesions. Neurologic: Grossly intact, no focal deficits, moving all 4 extremities. Psychiatric: Normal mood and affect.  Laboratory Data: Lab Results  Component Value Date   WBC 7.9 09/29/2019   HGB 11.7 (L) 09/29/2019   HCT 37.3 (L) 09/29/2019   MCV 75.7 (L) 09/29/2019   PLT 361  09/29/2019    Lab Results  Component Value Date   CREATININE 1.56 (H) 09/29/2019    No results found for: PSA  No results found for: TESTOSTERONE  No results found for: HGBA1C  Urinalysis    Component Value Date/Time   COLORURINE YELLOW (A) 08/15/2016 1524   APPEARANCEUR Clear 09/01/2016 1306   LABSPEC 1.020 08/15/2016 1524   PHURINE 5.0 08/15/2016 1524   GLUCOSEU 3+ (A) 09/01/2016 1306   HGBUR NEGATIVE 08/15/2016 1524   BILIRUBINUR Negative 09/01/2016 1306   KETONESUR NEGATIVE 08/15/2016 1524   PROTEINUR 2+ (A) 09/01/2016 1306   PROTEINUR 100 (A) 08/15/2016 1524    NITRITE Negative 09/01/2016 1306   NITRITE NEGATIVE 08/15/2016 1524   LEUKOCYTESUR Negative 09/01/2016 1306    Lab Results  Component Value Date   LABMICR See below: 09/01/2016   WBCUA 0-5 09/01/2016   RBCUA 0-2 09/01/2016   LABEPIT CANCELED 09/01/2016   MUCUS Present (A) 09/01/2016   BACTERIA NONE SEEN 08/15/2016    Pertinent Imaging: *** No results found for this or any previous visit.  No results found for this or any previous visit.  No results found for this or any previous visit.  No results found for this or any previous visit.  No results found for this or any previous visit.  No results found for this or any previous visit.  No results found for this or any previous visit.  No results found for this or any previous visit.   Assessment & Plan:    There are no diagnoses linked to this encounter.  No follow-ups on file.  Hollice Espy, MD  [facility] [address] [place], [place] [address] [phone]

## 2019-12-30 ENCOUNTER — Ambulatory Visit: Payer: Self-pay | Admitting: Urology

## 2020-01-10 ENCOUNTER — Other Ambulatory Visit: Payer: Self-pay | Admitting: Internal Medicine

## 2020-01-12 ENCOUNTER — Other Ambulatory Visit: Payer: Self-pay

## 2020-01-12 ENCOUNTER — Other Ambulatory Visit: Payer: Self-pay | Admitting: *Deleted

## 2020-01-12 ENCOUNTER — Encounter: Payer: Self-pay | Admitting: Oncology

## 2020-01-12 ENCOUNTER — Inpatient Hospital Stay: Payer: Medicare HMO | Attending: Oncology

## 2020-01-12 ENCOUNTER — Inpatient Hospital Stay (HOSPITAL_BASED_OUTPATIENT_CLINIC_OR_DEPARTMENT_OTHER): Payer: Medicare HMO | Admitting: Oncology

## 2020-01-12 VITALS — BP 154/89 | HR 82 | Temp 98.2°F | Resp 16 | Ht 64.0 in | Wt 118.2 lb

## 2020-01-12 DIAGNOSIS — E785 Hyperlipidemia, unspecified: Secondary | ICD-10-CM | POA: Insufficient documentation

## 2020-01-12 DIAGNOSIS — Z8042 Family history of malignant neoplasm of prostate: Secondary | ICD-10-CM | POA: Diagnosis not present

## 2020-01-12 DIAGNOSIS — E538 Deficiency of other specified B group vitamins: Secondary | ICD-10-CM

## 2020-01-12 DIAGNOSIS — N189 Chronic kidney disease, unspecified: Secondary | ICD-10-CM | POA: Diagnosis not present

## 2020-01-12 DIAGNOSIS — Z8051 Family history of malignant neoplasm of kidney: Secondary | ICD-10-CM | POA: Diagnosis not present

## 2020-01-12 DIAGNOSIS — Z79899 Other long term (current) drug therapy: Secondary | ICD-10-CM | POA: Insufficient documentation

## 2020-01-12 DIAGNOSIS — Z8052 Family history of malignant neoplasm of bladder: Secondary | ICD-10-CM | POA: Insufficient documentation

## 2020-01-12 DIAGNOSIS — I129 Hypertensive chronic kidney disease with stage 1 through stage 4 chronic kidney disease, or unspecified chronic kidney disease: Secondary | ICD-10-CM | POA: Diagnosis not present

## 2020-01-12 DIAGNOSIS — D509 Iron deficiency anemia, unspecified: Secondary | ICD-10-CM

## 2020-01-12 DIAGNOSIS — Z905 Acquired absence of kidney: Secondary | ICD-10-CM | POA: Insufficient documentation

## 2020-01-12 DIAGNOSIS — Z86010 Personal history of colonic polyps: Secondary | ICD-10-CM | POA: Insufficient documentation

## 2020-01-12 LAB — CBC WITH DIFFERENTIAL/PLATELET
Abs Immature Granulocytes: 0.01 10*3/uL (ref 0.00–0.07)
Basophils Absolute: 0.1 10*3/uL (ref 0.0–0.1)
Basophils Relative: 1 %
Eosinophils Absolute: 0.6 10*3/uL — ABNORMAL HIGH (ref 0.0–0.5)
Eosinophils Relative: 8 %
HCT: 46.4 % (ref 39.0–52.0)
Hemoglobin: 15.2 g/dL (ref 13.0–17.0)
Immature Granulocytes: 0 %
Lymphocytes Relative: 22 %
Lymphs Abs: 1.5 10*3/uL (ref 0.7–4.0)
MCH: 28.3 pg (ref 26.0–34.0)
MCHC: 32.8 g/dL (ref 30.0–36.0)
MCV: 86.2 fL (ref 80.0–100.0)
Monocytes Absolute: 0.5 10*3/uL (ref 0.1–1.0)
Monocytes Relative: 6 %
Neutro Abs: 4.5 10*3/uL (ref 1.7–7.7)
Neutrophils Relative %: 63 %
Platelets: 284 10*3/uL (ref 150–400)
RBC: 5.38 MIL/uL (ref 4.22–5.81)
RDW: 16.4 % — ABNORMAL HIGH (ref 11.5–15.5)
WBC: 7.1 10*3/uL (ref 4.0–10.5)
nRBC: 0 % (ref 0.0–0.2)

## 2020-01-12 LAB — BASIC METABOLIC PANEL
Anion gap: 7 (ref 5–15)
BUN: 23 mg/dL (ref 8–23)
CO2: 29 mmol/L (ref 22–32)
Calcium: 8.9 mg/dL (ref 8.9–10.3)
Chloride: 101 mmol/L (ref 98–111)
Creatinine, Ser: 1.5 mg/dL — ABNORMAL HIGH (ref 0.61–1.24)
GFR, Estimated: 50 mL/min — ABNORMAL LOW (ref >60)
Glucose, Bld: 103 mg/dL — ABNORMAL HIGH (ref 70–99)
Potassium: 4.2 mmol/L (ref 3.5–5.1)
Sodium: 137 mmol/L (ref 135–145)

## 2020-01-12 LAB — FERRITIN: Ferritin: 61 ng/mL (ref 24–336)

## 2020-01-12 LAB — IRON AND TIBC
Iron: 71 ug/dL (ref 45–182)
Saturation Ratios: 17 % — ABNORMAL LOW (ref 17.9–39.5)
TIBC: 416 ug/dL (ref 250–450)
UIBC: 345 ug/dL

## 2020-01-12 LAB — VITAMIN B12: Vitamin B-12: 155 pg/mL — ABNORMAL LOW (ref 180–914)

## 2020-01-12 MED ORDER — BENAZEPRIL HCL 20 MG PO TABS
20.0000 mg | ORAL_TABLET | Freq: Every day | ORAL | 3 refills | Status: DC
Start: 2020-01-12 — End: 2020-04-13

## 2020-01-12 NOTE — Progress Notes (Signed)
Pt feels better than before. No concerns. Eating and drinking good, bowels good,

## 2020-01-13 NOTE — Progress Notes (Signed)
Hematology/Oncology Consult note [facility]  [phone] [phone]  Patient Care Team: Theotis Burrow, MD as PCP - General (Family Medicine)   Name of the patient: Ricky Jarvis  [id number]  [birth year]   Date of visit: 01/13/20  Diagnosis- normocytic anemia likely a combination of iron and B12 deficiency as well as anemia of chronic kidney disease  Chief complaint/ Reason for visit-routine follow-up of anemia  Heme/Onc history: patient is a 69 year old gentleman from [place] who speaksUrduand limited English.I was able to converse with him in Hindi which is a language both of [place] can understand comprehend well. His past medical history significant for hypertension hyperlipidemia and chronic kidney disease. He was referred for anemia. Most recent CBC from 09/19/2019 showed white count of 7.4, H&H of 11.3/37.6 with an MCV of 77.8 and a platelet count of 352. Serum creatinine elevated at 1.5. Last iron studies checked in March 2021 showed a ferritin level of 8 and elevated TIBC. He has seen Dr. Drucilla Schmidt in the past and has undergone colonoscopy in October 2020 but has not undergone EGD. Colonoscopy showed 2 small polyps in the rectum and cecum which were negative for malignancy. No active bleeding was noted at that time. Patient currently reports ongoing fatigue. He has lost 4 pounds in the last 1 month. He continues to be active and works 40 hours a week.    Results of blood work from 09/29/2019 were as follows: CBC showed white count of 7.9, H&H of 11.7/37.3 with an MCV of 75.7 and a platelet count of 361.  CMP was significant for elevated creatinine of 1.5.  Folate levels were normal.  B12 level was normal at 109.  Ferritin level was low at 7 with iron studies showing elevated TIBC.  Myeloma panel showed polyclonal increase in immunoglobulins.  Haptoglobin and TSH was normal. Patient received IV iron and B12 shots in  September 2021  Interval history-patient reports having more energy since receiving IV iron and B12.  He denies any complaints at this time  ECOG PS- 1 Pain scale- 0   Review of systems- Review of Systems  Constitutional: Negative for chills, fever, malaise/fatigue and weight loss.  HENT: Negative for congestion, ear discharge and nosebleeds.   Eyes: Negative for blurred vision.  Respiratory: Negative for cough, hemoptysis, sputum production, shortness of breath and wheezing.   Cardiovascular: Negative for chest pain, palpitations, orthopnea and claudication.  Gastrointestinal: Negative for abdominal pain, blood in stool, constipation, diarrhea, heartburn, melena, nausea and vomiting.  Genitourinary: Negative for dysuria, flank pain, frequency, hematuria and urgency.  Musculoskeletal: Negative for back pain, joint pain and myalgias.  Skin: Negative for rash.  Neurological: Negative for dizziness, tingling, focal weakness, seizures, weakness and headaches.  Endo/Heme/Allergies: Does not bruise/bleed easily.  Psychiatric/Behavioral: Negative for depression and suicidal ideas. The patient does not have insomnia.       Allergies  Allergen Reactions  . Pork-Derived Products Anaphylaxis  . Azithromycin     Other reaction(s): Other (See Comments)  Pt states that he has hiccups when he takes this medication  . Ibuprofen      Past Medical History:  Diagnosis Date  . Anemia   . Arthritis   . Cancer ([place])   . Diabetes ([place]) 03/26/2013  . GERD (gastroesophageal reflux disease) 03/26/2013  . Gout   . History of hematuria   . HTN (hypertension) 03/26/2013     Past Surgical History:  Procedure Laterality Date  . APPENDECTOMY    .  COLONOSCOPY WITH PROPOFOL N/A 11/18/2018   Procedure: COLONOSCOPY WITH PROPOFOL;  Surgeon: Lin Landsman, MD;  Location: [facility] ENDOSCOPY;  Service: Gastroenterology;  Laterality: N/A;  . ROBOTIC ASSITED PARTIAL NEPHRECTOMY Left 08/29/2016   Procedure:  ROBOTIC ASSITED PARTIAL NEPHRECTOMY;  Surgeon: Hollice Espy, MD;  Location: [facility] ORS;  Service: Urology;  Laterality: Left;  . UPPER GI ENDOSCOPY      Social History   Socioeconomic History  . Marital status: Married    Spouse name: Not on file  . Number of children: Not on file  . Years of education: Not on file  . Highest education level: Not on file  Occupational History  . Not on file  Tobacco Use  . Smoking status: Never Smoker  . Smokeless tobacco: Never Used  Vaping Use  . Vaping Use: Never used  Substance and Sexual Activity  . Alcohol use: No  . Drug use: No  . Sexual activity: Not Currently  Other Topics Concern  . Not on file  Social History Narrative  . Not on file   Social Determinants of Health   Financial Resource Strain:   . Difficulty of Paying Living Expenses: Not on file  Food Insecurity:   . Worried About Charity fundraiser in the Last Year: Not on file  . Ran Out of Food in the Last Year: Not on file  Transportation Needs:   . Lack of Transportation (Medical): Not on file  . Lack of Transportation (Non-Medical): Not on file  Physical Activity:   . Days of Exercise per Week: Not on file  . Minutes of Exercise per Session: Not on file  Stress:   . Feeling of Stress : Not on file  Social Connections:   . Frequency of Communication with Friends and Family: Not on file  . Frequency of Social Gatherings with Friends and Family: Not on file  . Attends Religious Services: Not on file  . Active Member of Clubs or Organizations: Not on file  . Attends Archivist Meetings: Not on file  . Marital Status: Not on file  Intimate Partner Violence:   . Fear of Current or Ex-Partner: Not on file  . Emotionally Abused: Not on file  . Physically Abused: Not on file  . Sexually Abused: Not on file    Family History  Problem Relation Age of Onset  . Kidney cancer Mother   . Prostate cancer Neg Hx   . Bladder Cancer Neg Hx      Current  Outpatient Medications:  .  amLODipine (NORVASC) 5 MG tablet, TAKE 1 TABLET BY MOUTH DAILY, Disp: 90 tablet, Rfl: 3 .  cholecalciferol (VITAMIN D) 1000 units tablet, Take 1,000 Units by mouth daily., Disp: , Rfl:  .  JANUMET 50-500 MG tablet, TAKE 1 TABLET BY MOUTH TWICE DAILY, Disp: 180 tablet, Rfl: 3 .  lovastatin (MEVACOR) 20 MG tablet, Take 20 mg by mouth at bedtime. , Disp: , Rfl:  .  omeprazole (PRILOSEC) 20 MG capsule, Take 20 mg by mouth. , Disp: , Rfl:  .  rosuvastatin (CRESTOR) 20 MG tablet, Take 1 tablet by mouth once daily, Disp: 90 tablet, Rfl: 0 .  benazepril (LOTENSIN) 20 MG tablet, Take 1 tablet (20 mg total) by mouth at bedtime., Disp: 90 tablet, Rfl: 3  Physical exam:  Vitals:   01/12/20 1117  BP: (!) 154/89  Pulse: 82  Resp: 16  Temp: 98.2 F (36.8 C)  TempSrc: Oral  Weight: 118 lb 3.2 oz (  53.6 kg)  Height: 5\' 4"  (1.626 m)   Physical Exam Constitutional:      General: He is not in acute distress. Cardiovascular:     Rate and Rhythm: Normal rate and regular rhythm.     Heart sounds: Normal heart sounds.  Pulmonary:     Effort: Pulmonary effort is normal.     Breath sounds: Normal breath sounds.  Abdominal:     General: Bowel sounds are normal.     Palpations: Abdomen is soft.  Skin:    General: Skin is warm and dry.  Neurological:     Mental Status: He is alert and oriented to person, place, and time.      CMP Latest Ref Rng & Units 01/12/2020  Glucose 70 - 99 mg/dL 103(H)  BUN 8 - 23 mg/dL 23  Creatinine 0.61 - 1.24 mg/dL 1.50(H)  Sodium 135 - 145 mmol/L 137  Potassium 3.5 - 5.1 mmol/L 4.2  Chloride 98 - 111 mmol/L 101  CO2 22 - 32 mmol/L 29  Calcium 8.9 - 10.3 mg/dL 8.9  Total Protein 6.5 - 8.1 g/dL -  Total Bilirubin 0.3 - 1.2 mg/dL -  Alkaline Phos 38 - 126 U/L -  AST 15 - 41 U/L -  ALT 0 - 44 U/L -   CBC Latest Ref Rng & Units 01/12/2020  WBC 4.0 - 10.5 K/uL 7.1  Hemoglobin 13.0 - 17.0 g/dL 15.2  Hematocrit 39 - 52 % 46.4  Platelets 150  - 400 K/uL 284      Assessment and plan- Patient is a 69 y.o. male with iron and B12 deficiency anemia here for routine follow-up  After receiving IV iron and B12 shots patient's hemoglobin has improved from 11.7-15.2.  Iron studies are presently normal although B12 levels are still low at 155.  I have given the patient the option of continuing B12 shots here versus taking oral B12 daily.  He wishes to take oral B12 1000 mcg.  I will repeat CBC ferritin iron studies and B12 levels in 3 months in 6 months and see him back in 6 months   Visit Diagnosis 1. B12 deficiency   2. Iron deficiency anemia, unspecified iron deficiency anemia type      Dr. Randa Evens, MD, MPH [facility] [phone] 01/13/2020 2:26 PM

## 2020-01-20 DIAGNOSIS — H40003 Preglaucoma, unspecified, bilateral: Secondary | ICD-10-CM | POA: Diagnosis not present

## 2020-01-23 DIAGNOSIS — R809 Proteinuria, unspecified: Secondary | ICD-10-CM | POA: Diagnosis not present

## 2020-01-23 DIAGNOSIS — D631 Anemia in chronic kidney disease: Secondary | ICD-10-CM | POA: Diagnosis not present

## 2020-01-23 DIAGNOSIS — N1831 Chronic kidney disease, stage 3a: Secondary | ICD-10-CM | POA: Diagnosis not present

## 2020-01-23 DIAGNOSIS — I10 Essential (primary) hypertension: Secondary | ICD-10-CM | POA: Diagnosis not present

## 2020-02-05 ENCOUNTER — Other Ambulatory Visit: Payer: Medicare HMO

## 2020-02-05 ENCOUNTER — Other Ambulatory Visit: Payer: Self-pay

## 2020-02-05 DIAGNOSIS — R972 Elevated prostate specific antigen [PSA]: Secondary | ICD-10-CM

## 2020-02-06 LAB — PSA: Prostate Specific Ag, Serum: 10.2 ng/mL — ABNORMAL HIGH (ref 0.0–4.0)

## 2020-02-10 ENCOUNTER — Ambulatory Visit: Payer: Medicare HMO

## 2020-02-11 ENCOUNTER — Ambulatory Visit: Payer: Self-pay | Admitting: Urology

## 2020-02-16 DIAGNOSIS — Z1389 Encounter for screening for other disorder: Secondary | ICD-10-CM | POA: Diagnosis not present

## 2020-02-16 DIAGNOSIS — Z23 Encounter for immunization: Secondary | ICD-10-CM | POA: Diagnosis not present

## 2020-02-16 DIAGNOSIS — E119 Type 2 diabetes mellitus without complications: Secondary | ICD-10-CM | POA: Diagnosis not present

## 2020-02-26 DIAGNOSIS — Z20822 Contact with and (suspected) exposure to covid-19: Secondary | ICD-10-CM | POA: Diagnosis not present

## 2020-03-01 DIAGNOSIS — D649 Anemia, unspecified: Secondary | ICD-10-CM | POA: Diagnosis not present

## 2020-03-01 DIAGNOSIS — I10 Essential (primary) hypertension: Secondary | ICD-10-CM | POA: Diagnosis not present

## 2020-03-12 ENCOUNTER — Other Ambulatory Visit: Payer: Self-pay

## 2020-03-12 ENCOUNTER — Ambulatory Visit
Admission: RE | Admit: 2020-03-12 | Discharge: 2020-03-12 | Disposition: A | Discharge status: Home / Self Care | Payer: Medicare Other | Source: Ambulatory Visit | Attending: Urology | Admitting: Urology

## 2020-03-12 DIAGNOSIS — N3289 Other specified disorders of bladder: Secondary | ICD-10-CM | POA: Diagnosis not present

## 2020-03-12 DIAGNOSIS — Z905 Acquired absence of kidney: Secondary | ICD-10-CM | POA: Diagnosis not present

## 2020-03-12 DIAGNOSIS — I700 Atherosclerosis of aorta: Secondary | ICD-10-CM | POA: Diagnosis not present

## 2020-03-12 DIAGNOSIS — C641 Malignant neoplasm of right kidney, except renal pelvis: Secondary | ICD-10-CM | POA: Diagnosis not present

## 2020-03-12 DIAGNOSIS — C642 Malignant neoplasm of left kidney, except renal pelvis: Secondary | ICD-10-CM | POA: Diagnosis not present

## 2020-03-12 HISTORY — DX: Disorder of kidney and ureter, unspecified: N28.9

## 2020-03-12 LAB — POCT I-STAT CREATININE: Creatinine, Ser: 1.6 mg/dL — ABNORMAL HIGH (ref 0.61–1.24)

## 2020-03-12 MED ORDER — IOHEXOL 300 MG/ML  SOLN
75.0000 mL | Freq: Once | INTRAMUSCULAR | Status: AC | PRN
  Administered 2020-03-12: 75 mL via INTRAVENOUS

## 2020-03-16 ENCOUNTER — Other Ambulatory Visit: Payer: Self-pay

## 2020-03-16 ENCOUNTER — Ambulatory Visit (INDEPENDENT_AMBULATORY_CARE_PROVIDER_SITE_OTHER): Payer: Medicare Other | Admitting: Urology

## 2020-03-16 ENCOUNTER — Ambulatory Visit
Admission: RE | Admit: 2020-03-16 | Discharge: 2020-03-16 | Disposition: A | Discharge status: Home / Self Care | Payer: Medicare Other | Source: Ambulatory Visit | Attending: Urology | Admitting: Urology

## 2020-03-16 VITALS — BP 119/76 | HR 73 | Wt 114.0 lb

## 2020-03-16 DIAGNOSIS — R972 Elevated prostate specific antigen [PSA]: Secondary | ICD-10-CM

## 2020-03-16 DIAGNOSIS — C642 Malignant neoplasm of left kidney, except renal pelvis: Secondary | ICD-10-CM | POA: Diagnosis not present

## 2020-03-16 DIAGNOSIS — Z85528 Personal history of other malignant neoplasm of kidney: Secondary | ICD-10-CM | POA: Diagnosis not present

## 2020-03-16 NOTE — Progress Notes (Signed)
03/16/2020 2:33 PM   Ricky Jarvis February 15, 1950 [id number]  Referring provider: Theotis Burrow, MD [address] [place],  [place] [address]  Chief Complaint  Patient presents with   Elevated PSA    HPI: 70 y.o. Mwith elevated/rising PSAandhistory of bilateral renal cell carcinoma status post left robotic partial nephrectomy and right nephrectomyreturns today for review of MRI.   He underwent prostate bx on 12/18/18 which was negativethe setting of rising PSA, please see previous notes for details.His PSA seems to stabilize, most recently 10.2 in 02/05/2020.  Prostate MRI on 08/06/2019 revealed no radiographic evidence of high-grade prostate carcinoma. PI-RADS 2: Low (clinically significant cancer is unlikely to be present).  No significant urinary symptoms.  Personal history of bilateral RCC, status post left robotic partial nephrectomy 7/2018and more recently right robotic nephrectomy 05/2017, P T3a. He returns today with serial surveillance imaging. He did not get his chest x-ray today.   Most recently CT A/P w/ contrastfrom 2/4/22revealed stable postsurgical change from partial nephrectomy involving theinferior pole of the left kidney. No specific findings identified to suggest residual or recurrence of tumor.Also noted enlarged prostate gland.No recent Chest x-ray.  Creatinine 1.60 as of 2.4/22   PMH: Past Medical History:  Diagnosis Date   Anemia    Arthritis    Cancer (Home)    Diabetes ([place]) 03/26/2013   GERD (gastroesophageal reflux disease) 03/26/2013   Gout    History of hematuria    HTN (hypertension) 03/26/2013   Renal insufficiency     Surgical History: Past Surgical History:  Procedure Laterality Date   APPENDECTOMY     COLONOSCOPY WITH PROPOFOL N/A 11/18/2018   Procedure: COLONOSCOPY WITH PROPOFOL;  Surgeon: Lin Landsman, MD;  Location: [facility] ENDOSCOPY;  Service: Gastroenterology;  Laterality: N/A;    ROBOTIC ASSITED PARTIAL NEPHRECTOMY Left 08/29/2016   Procedure: ROBOTIC ASSITED PARTIAL NEPHRECTOMY;  Surgeon: Hollice Espy, MD;  Location: [facility] ORS;  Service: Urology;  Laterality: Left;   UPPER GI ENDOSCOPY      Home Medications:  Allergies as of 03/16/2020      Reactions   Pork-derived Products Anaphylaxis   Azithromycin    Other reaction(s): Other (See Comments)  Pt states that he has hiccups when he takes this medication   Ibuprofen       Medication List       Accurate as of March 16, 2020  2:33 PM. If you have any questions, ask your nurse or doctor.        Allergy Relief 10 MG tablet Generic drug: loratadine Take 10 mg by mouth daily.   amLODipine 5 MG tablet Commonly known as: NORVASC TAKE 1 TABLET BY MOUTH DAILY   benazepril 20 MG tablet Commonly known as: LOTENSIN Take 1 tablet (20 mg total) by mouth at bedtime.   cholecalciferol 1000 units tablet Commonly known as: VITAMIN D Take 1,000 Units by mouth daily.   FLUoxetine 10 MG capsule Commonly known as: PROZAC Take 10 mg by mouth daily.   Janumet 50-500 MG tablet Generic drug: sitaGLIPtin-metformin TAKE 1 TABLET BY MOUTH TWICE DAILY   lovastatin 20 MG tablet Commonly known as: MEVACOR Take 20 mg by mouth at bedtime.   omeprazole 20 MG capsule Commonly known as: PRILOSEC Take 20 mg by mouth.   rosuvastatin 20 MG tablet Commonly known as: CRESTOR Take 1 tablet by mouth once daily       Allergies:  Allergies  Allergen Reactions   Pork-Derived Products Anaphylaxis   Azithromycin  Other reaction(s): Other (See Comments)  Pt states that he has hiccups when he takes this medication   Ibuprofen     Family History: Family History  Problem Relation Age of Onset   Kidney cancer Mother    Prostate cancer Neg Hx    Bladder Cancer Neg Hx     Social History:  reports that he has never smoked. He has never used smokeless tobacco. He reports that he does not drink alcohol and does  not use drugs.   Physical Exam: BP 119/76    Pulse 73    Wt 114 lb (51.7 kg)    BMI 19.57 kg/m   Constitutional:  Alert and oriented, No acute distress.  Accompanied by wife today.   HEENT: [place] AT, moist mucus membranes.  Trachea midline, no masses. Cardiovascular: No clubbing, cyanosis, or edema. Respiratory: Normal respiratory effort, no increased work of breathing. Skin: No rashes, bruises or suspicious lesions. Neurologic: Grossly intact, no focal deficits, moving all 4 extremities. Psychiatric: Normal mood and affect.  Laboratory Data: Lab Results  Component Value Date   WBC 7.1 01/12/2020   HGB 15.2 01/12/2020   HCT 46.4 01/12/2020   MCV 86.2 01/12/2020   PLT 284 01/12/2020    Lab Results  Component Value Date   CREATININE 1.60 (H) 03/12/2020   Narrative & Impression  CLINICAL DATA:  Renal cell carcinoma, status post right nephrectomy  EXAM: CT ABDOMEN AND PELVIS WITH CONTRAST  TECHNIQUE: Multidetector CT imaging of the abdomen and pelvis was performed using the standard protocol following bolus administration of intravenous contrast.  CONTRAST:  69mL OMNIPAQUE IOHEXOL 300 MG/ML  SOLN  COMPARISON:  06/25/2019  FINDINGS: Examination of the abdomen is generally somewhat limited by breath motion artifact.  Lower chest: No acute abnormality.  Hepatobiliary: No solid liver abnormality is seen. No gallstones, gallbladder wall thickening, or biliary dilatation.  Pancreas: Unremarkable. No pancreatic ductal dilatation or surrounding inflammatory changes.  Spleen: Normal in size without significant abnormality.  Adrenals/Urinary Tract: Adrenal glands are unremarkable. Status post right nephrectomy. No evidence of recurrent soft tissue or suspicious contrast enhancement in the nephrectomy bed. The left kidney is normal. Thickening of the urinary bladder wall.  Stomach/Bowel: Stomach is within normal limits. Appendix is not clearly visualized. No  evidence of bowel wall thickening, distention, or inflammatory changes.  Vascular/Lymphatic: Aortic atherosclerosis. No enlarged abdominal or pelvic lymph nodes.  Reproductive: Prostatomegaly.  Other: No abdominal wall hernia or abnormality. No abdominopelvic ascites.  Musculoskeletal: No acute or significant osseous findings.  IMPRESSION: 1. Status post right nephrectomy. No evidence of recurrent soft tissue or suspicious contrast enhancement in the nephrectomy bed. No evidence of lymphadenopathy or metastatic disease in the abdomen. 2. Prostatomegaly with thickening of the urinary bladder wall, consistent with chronic outlet obstruction.  Aortic Atherosclerosis (ICD10-I70.0).   Electronically Signed   By: Eddie Candle M.D.   CT scan was personally reviewed, agree with radiologic interpretation  Assessment & Plan:     1. Clear cell renal cell carcinoma, left and right No evidence of disease  Due for chest x-ray today-we will send the patient to get it when he leaves the office  Personal history of bilateral disease, most recently pT3 on the right status post nephrectomy in 2019  Given that its been 3 years, will imaging to annually until he is 5 years disease-free.  He is agreeable this plan.  2. Elevated PSA PSA has stabilzed  Extensive recent evaluation with biopsy as well as reassuring MRI  Continue to follow, repeat PSA DRE next year   Follow-up in 1 year with CT abdomen pelvis, chest x-ray, PSA/DRE  Hollice Espy, MD  [place] [place] [address] [place], [place] [address] [phone]

## 2020-04-01 DIAGNOSIS — E119 Type 2 diabetes mellitus without complications: Secondary | ICD-10-CM | POA: Diagnosis not present

## 2020-04-08 ENCOUNTER — Other Ambulatory Visit: Payer: Self-pay | Admitting: Internal Medicine

## 2020-04-12 ENCOUNTER — Other Ambulatory Visit: Payer: Self-pay

## 2020-04-12 ENCOUNTER — Inpatient Hospital Stay: Payer: Medicare Other | Attending: Oncology

## 2020-04-12 DIAGNOSIS — E538 Deficiency of other specified B group vitamins: Secondary | ICD-10-CM | POA: Insufficient documentation

## 2020-04-12 DIAGNOSIS — D509 Iron deficiency anemia, unspecified: Secondary | ICD-10-CM | POA: Insufficient documentation

## 2020-04-12 LAB — VITAMIN B12: Vitamin B-12: 947 pg/mL — ABNORMAL HIGH (ref 180–914)

## 2020-04-12 LAB — CBC WITH DIFFERENTIAL/PLATELET
Abs Immature Granulocytes: 0.02 10*3/uL (ref 0.00–0.07)
Basophils Absolute: 0.1 10*3/uL (ref 0.0–0.1)
Basophils Relative: 1 %
Eosinophils Absolute: 0.3 10*3/uL (ref 0.0–0.5)
Eosinophils Relative: 4 %
HCT: 45.9 % (ref 39.0–52.0)
Hemoglobin: 15 g/dL (ref 13.0–17.0)
Immature Granulocytes: 0 %
Lymphocytes Relative: 14 %
Lymphs Abs: 1.3 10*3/uL (ref 0.7–4.0)
MCH: 28.5 pg (ref 26.0–34.0)
MCHC: 32.7 g/dL (ref 30.0–36.0)
MCV: 87.1 fL (ref 80.0–100.0)
Monocytes Absolute: 0.7 10*3/uL (ref 0.1–1.0)
Monocytes Relative: 8 %
Neutro Abs: 6.6 10*3/uL (ref 1.7–7.7)
Neutrophils Relative %: 73 %
Platelets: 331 10*3/uL (ref 150–400)
RBC: 5.27 MIL/uL (ref 4.22–5.81)
RDW: 12.6 % (ref 11.5–15.5)
WBC: 9 10*3/uL (ref 4.0–10.5)
nRBC: 0 % (ref 0.0–0.2)

## 2020-04-12 LAB — IRON AND TIBC
Iron: 67 ug/dL (ref 45–182)
Saturation Ratios: 16 % — ABNORMAL LOW (ref 17.9–39.5)
TIBC: 410 ug/dL (ref 250–450)
UIBC: 343 ug/dL

## 2020-04-12 LAB — FERRITIN: Ferritin: 64 ng/mL (ref 24–336)

## 2020-04-13 ENCOUNTER — Other Ambulatory Visit: Payer: Self-pay | Admitting: *Deleted

## 2020-04-13 MED ORDER — BENAZEPRIL HCL 20 MG PO TABS: 20.0000 mg | ORAL_TABLET | Freq: Every day | ORAL | 3 refills | Status: DC

## 2020-05-03 DIAGNOSIS — E119 Type 2 diabetes mellitus without complications: Secondary | ICD-10-CM | POA: Diagnosis not present

## 2020-05-03 DIAGNOSIS — I10 Essential (primary) hypertension: Secondary | ICD-10-CM | POA: Diagnosis not present

## 2020-05-17 ENCOUNTER — Other Ambulatory Visit: Payer: Self-pay | Admitting: Internal Medicine

## 2020-05-27 DIAGNOSIS — D631 Anemia in chronic kidney disease: Secondary | ICD-10-CM | POA: Diagnosis not present

## 2020-05-27 DIAGNOSIS — N1831 Chronic kidney disease, stage 3a: Secondary | ICD-10-CM | POA: Diagnosis not present

## 2020-05-27 DIAGNOSIS — I10 Essential (primary) hypertension: Secondary | ICD-10-CM | POA: Diagnosis not present

## 2020-05-27 DIAGNOSIS — R809 Proteinuria, unspecified: Secondary | ICD-10-CM | POA: Diagnosis not present

## 2020-07-04 ENCOUNTER — Other Ambulatory Visit: Payer: Self-pay | Admitting: Internal Medicine

## 2020-07-06 ENCOUNTER — Other Ambulatory Visit: Payer: Self-pay | Admitting: Internal Medicine

## 2020-07-12 ENCOUNTER — Other Ambulatory Visit: Payer: Medicare HMO

## 2020-07-12 ENCOUNTER — Ambulatory Visit: Payer: Medicare HMO | Admitting: Oncology

## 2020-07-19 ENCOUNTER — Ambulatory Visit (INDEPENDENT_AMBULATORY_CARE_PROVIDER_SITE_OTHER): Payer: Medicare Other | Admitting: Internal Medicine

## 2020-07-19 ENCOUNTER — Encounter: Payer: Self-pay | Admitting: Internal Medicine

## 2020-07-19 ENCOUNTER — Other Ambulatory Visit: Payer: Self-pay

## 2020-07-19 DIAGNOSIS — R809 Proteinuria, unspecified: Secondary | ICD-10-CM

## 2020-07-19 DIAGNOSIS — E1129 Type 2 diabetes mellitus with other diabetic kidney complication: Secondary | ICD-10-CM

## 2020-07-19 DIAGNOSIS — K219 Gastro-esophageal reflux disease without esophagitis: Secondary | ICD-10-CM

## 2020-07-19 DIAGNOSIS — I150 Renovascular hypertension: Secondary | ICD-10-CM

## 2020-07-19 DIAGNOSIS — U071 COVID-19: Secondary | ICD-10-CM

## 2020-07-19 LAB — POC COVID19 BINAXNOW: SARS Coronavirus 2 Ag: NEGATIVE

## 2020-07-19 NOTE — Assessment & Plan Note (Signed)
-   The patient's blood sugar is under control on med. - The patient will continue the current treatment regimen.  - I encouraged the patient to regularly check blood sugar.  - I encouraged the patient to monitor diet. I encouraged the patient to eat low-carb and low-sugar to help prevent blood sugar spikes.  - I encouraged the patient to continue following their prescribed treatment plan for diabetes - I informed the patient to get help if blood sugar drops below 54mg /dL, or if suddenly have trouble thinking clearly or breathing.  Patient was advised to buy a book on diabetes from a local bookstore or from [place]  Patient should read 2 chapters every day to keep the motivation going, this is in addition to some of the materials we provided them from the office.  There are other resources on the Internet like YouTube and wilkipedia to get an education on the diabetes

## 2020-07-19 NOTE — Progress Notes (Signed)
142  

## 2020-07-19 NOTE — Assessment & Plan Note (Signed)
Nasal swab is positive for COVID, will treat the patient symptomatically

## 2020-07-19 NOTE — Progress Notes (Signed)
Established Patient Office Visit  Subjective:  Patient ID: Ricky Jarvis, male    DOB: 1950/03/03  Age: 70 y.o. MRN: [id number]  CC: No chief complaint on file.   [place]    07/19/2020  To Whom It May Concern:   Ricky Jarvis Jan 18, 1951    The above named has been evaluated today due to possible Coronavirus (COVID-19) infection.   Ricky Jarvis has met the Centers for Disease Control and Prevention (CDC) "Non-Test Criteria for Ending Self-Isolation" which includes all of the following:  at least 10 days since symptoms onset AND 3 days fever free without antipyretics (Tylenol or Ibuprofen) AND improvement in respiratory symptoms.  It is therefore my medical opinion that Ricky Jarvis is medically fit and safe to resume normal work duties and activities.   Do not hesitate to call us should you have any question or concern.   Sincerely,    Ricky Athens, MD [place] [address] [place] [address] Dept: [phone]    URI  This is a new problem. The current episode started in the past 7 days. The problem has been gradually improving. There has been no fever. Associated symptoms include congestion, coughing, rhinorrhea and sneezing. Pertinent negatives include no abdominal pain, chest pain, diarrhea, dysuria, ear pain, headaches, joint swelling or neck pain. He has tried decongestant, antihistamine, increased fluids and acetaminophen for the symptoms.   Ricky Jarvis presents for uri  Past Medical History:  Diagnosis Date   Anemia    Arthritis    Cancer ([place])    Diabetes ([place]) 03/26/2013   GERD (gastroesophageal reflux disease) 03/26/2013   Gout    History of hematuria    HTN (hypertension) 03/26/2013   Renal insufficiency     Past Surgical History:  Procedure Laterality Date   APPENDECTOMY     COLONOSCOPY WITH PROPOFOL N/A 11/18/2018    Procedure: COLONOSCOPY WITH PROPOFOL;  Surgeon: Lin Landsman, MD;  Location: [facility];  Service: Gastroenterology;  Laterality: N/A;   ROBOTIC ASSITED PARTIAL NEPHRECTOMY Left 08/29/2016   Procedure: ROBOTIC ASSITED PARTIAL NEPHRECTOMY;  Surgeon: Hollice Espy, MD;  Location: [facility];  Service: Urology;  Laterality: Left;   UPPER GI ENDOSCOPY      Family History  Problem Relation Age of Onset   Kidney cancer Mother    Prostate cancer Neg Hx    Bladder Cancer Neg Hx     Social History   Socioeconomic History   Marital status: Married    Spouse name: Not on file   Number of children: Not on file   Years of education: Not on file   Highest education level: Not on file  Occupational History   Not on file  Tobacco Use   Smoking status: Never   Smokeless tobacco: Never  Vaping Use   Vaping Use: Never used  Substance and Sexual Activity   Alcohol use: No   Drug use: No   Sexual activity: Not Currently  Other Topics Concern   Not on file  Social History Narrative   Not on file   Social Determinants of Health   Financial Resource Strain: Not on file  Food Insecurity: Not on file  Transportation Needs: Not on file  Physical Activity: Not on file  Stress: Not on file  Social Connections: Not on file  Intimate Partner Violence: Not on file     Current Outpatient Medications:  ALLERGY RELIEF 10 MG tablet, Take 10 mg by mouth daily., Disp: , Rfl:    amLODipine (NORVASC) 5 MG tablet, TAKE 1 TABLET BY MOUTH DAILY, Disp: 90 tablet, Rfl: 3   benazepril (LOTENSIN) 20 MG tablet, Take 1 tablet (20 mg total) by mouth at bedtime., Disp: 90 tablet, Rfl: 3   cholecalciferol (VITAMIN D) 1000 units tablet, Take 1,000 Units by mouth daily., Disp: , Rfl:    FLUoxetine (PROZAC) 10 MG capsule, Take 10 mg by mouth daily., Disp: , Rfl:    JANUMET 50-500 MG tablet, TAKE 1 TABLET BY MOUTH TWICE DAILY, Disp: 180 tablet, Rfl: 3   lovastatin (MEVACOR) 20 MG tablet, Take 20 mg by  mouth at bedtime. , Disp: , Rfl:    omeprazole (PRILOSEC) 20 MG capsule, Take 20 mg by mouth. , Disp: , Rfl:    rosuvastatin (CRESTOR) 20 MG tablet, Take 1 tablet by mouth once daily, Disp: 90 tablet, Rfl: 0   Allergies  Allergen Reactions   Pork-Derived Products Anaphylaxis   Azithromycin     Other reaction(s): Other (See Comments)  Pt states that he has hiccups when he takes this medication   Ibuprofen     ROS Review of Systems  HENT:  Positive for congestion, rhinorrhea and sneezing. Negative for ear pain.   Respiratory:  Positive for cough.   Cardiovascular:  Negative for chest pain.  Gastrointestinal:  Negative for abdominal pain and diarrhea.  Genitourinary:  Negative for dysuria.  Musculoskeletal:  Negative for neck pain.  Neurological:  Negative for headaches.     Objective:    Physical Exam Vitals reviewed.  Constitutional:      Appearance: Normal appearance.  HENT:     Nose: Congestion present.     Mouth/Throat:     Mouth: Mucous membranes are moist.     Pharynx: Oropharyngeal exudate present.  Eyes:     General: No scleral icterus.    Conjunctiva/sclera: Conjunctivae normal.     Pupils: Pupils are equal, round, and reactive to light.  Neck:     Vascular: No carotid bruit.  Cardiovascular:     Rate and Rhythm: Normal rate and regular rhythm.     Pulses: Normal pulses.     Heart sounds: Normal heart sounds.  Pulmonary:     Effort: Pulmonary effort is normal.     Breath sounds: Normal breath sounds.  Abdominal:     General: Bowel sounds are normal.     Palpations: Abdomen is soft. There is no hepatomegaly, splenomegaly or mass.     Tenderness: There is no abdominal tenderness.     Hernia: No hernia is present.  Musculoskeletal:     Cervical back: Neck supple.     Right lower leg: No edema.     Left lower leg: No edema.  Skin:    Coloration: Skin is not jaundiced.     Findings: No bruising or rash.  Neurological:     General: No focal deficit  present.     Mental Status: He is alert and oriented to person, place, and time.     Motor: No weakness.  Psychiatric:        Mood and Affect: Mood normal.        Behavior: Behavior normal.        Thought Content: Thought content normal.    There were no vitals taken for this visit. Wt Readings from Last 3 Encounters:  03/16/20 114 lb (51.7 kg)  01/12/20 118 lb 3.2 oz (  53.6 kg)  10/09/19 112 lb 8 oz (51 kg)     Health Maintenance Due  Topic Date Due   HEMOGLOBIN A1C  Never done   COVID-19 Vaccine (1) Never done   FOOT EXAM  Never done   OPHTHALMOLOGY EXAM  Never done   Hepatitis C Screening  Never done   PNA vac Low Risk Adult (2 of 2 - PPSV23) 10/01/2019    There are no preventive care reminders to display for this patient.  Lab Results  Component Value Date   TSH 1.010 09/29/2019   Lab Results  Component Value Date   WBC 9.0 04/12/2020   HGB 15.0 04/12/2020   HCT 45.9 04/12/2020   MCV 87.1 04/12/2020   PLT 331 04/12/2020   Lab Results  Component Value Date   NA 137 01/12/2020   K 4.2 01/12/2020   CO2 29 01/12/2020   GLUCOSE 103 (H) 01/12/2020   BUN 23 01/12/2020   CREATININE 1.60 (H) 03/12/2020   BILITOT 0.6 09/29/2019   ALKPHOS 54 09/29/2019   AST 13 (L) 09/29/2019   ALT 13 09/29/2019   PROT 8.3 (H) 09/29/2019   ALBUMIN 4.1 09/29/2019   CALCIUM 8.9 01/12/2020   ANIONGAP 7 01/12/2020   No results found for: CHOL No results found for: HDL No results found for: LDLCALC No results found for: TRIG No results found for: CHOLHDL No results found for: HGBA1C    Assessment & Plan:   Problem List Items Addressed This Visit       Cardiovascular and Mediastinum   HTN (hypertension)    Patient blood pressure is normal patient denies any chest pain or shortness of breath there is no history of palpitation or paroxysmal nocturnal dyspnea   patient was advised to follow low-salt low-cholesterol diet    ideally I want to keep systolic blood pressure below  130 mmHg, patient was asked to check blood pressure one times a week and give me a report on that.  Patient will be follow-up in 3 months  or earlier as needed, patient will call me back for any change in the cardiovascular symptoms Patient was advised to buy a book from local bookstore concerning blood pressure and read several chapters  every day.  This will be supplemented by some of the material we will give him from the office.  Patient should also utilize other resources like YouTube and Internet to learn more about the blood pressure and the diet.         Digestive   GERD (gastroesophageal reflux disease)    Counseling  If a person has gastroesophageal reflux disease (GERD), food and stomach acid move back up into the esophagus and cause symptoms or problems such as damage to the esophagus.  Anti-reflux measures include: raising the head of the bed, avoiding tight clothing or belts, avoiding eating late at night, not lying down shortly after mealtime, and achieving weight loss.  Avoid ASA, NSAID's, caffeine, alcohol, and tobacco.   OTC Pepcid and/or Tums are often very helpful for as needed use.   However, for persisting chronic or daily symptoms, stronger medications like Omeprazole may be needed.  You may need to avoid foods and drinks such as: ? Coffee and tea (with or without caffeine). ? Drinks that contain alcohol. ? Energy drinks and sports drinks. ? Bubbly (carbonated) drinks or sodas. ? Chocolate and cocoa. ? Peppermint and mint flavorings. ? Garlic and onions. ? Horseradish. ? Spicy and acidic foods. These include peppers, chili  powder, curry powder, vinegar, hot sauces, and BBQ sauce. ? Citrus fruit juices and citrus fruits, such as oranges, lemons, and limes. ? Tomato-based foods. These include red sauce, chili, salsa, and pizza with red sauce. ? Fried and fatty foods. These include donuts, french fries, potato chips, and high-fat dressings. ? High-fat meats. These  include hot dogs, rib eye steak, sausage, ham, and bacon.          Endocrine   Diabetes ([place])    - The patient's blood sugar is under control on med. - The patient will continue the current treatment regimen.  - I encouraged the patient to regularly check blood sugar.  - I encouraged the patient to monitor diet. I encouraged the patient to eat low-carb and low-sugar to help prevent blood sugar spikes.  - I encouraged the patient to continue following their prescribed treatment plan for diabetes - I informed the patient to get help if blood sugar drops below 9m/dL, or if suddenly have trouble thinking clearly or breathing.  Patient was advised to buy a book on diabetes from a local bookstore or from [place]  Patient should read 2 chapters every day to keep the motivation going, this is in addition to some of the materials we provided them from the office.  There are other resources on the Internet like YouTube and wilkipedia to get an education on the diabetes         Other   COVID - Primary    Nasal swab is positive for COVID, will treat the patient symptomatically       Relevant Orders   POC COVID-19 (Completed)    No orders of the defined types were placed in this encounter.   Follow-up: No follow-ups on file.    JCletis Athens MD

## 2020-07-19 NOTE — Assessment & Plan Note (Signed)

## 2020-07-19 NOTE — Assessment & Plan Note (Signed)

## 2020-07-29 ENCOUNTER — Telehealth: Payer: Self-pay | Admitting: Oncology

## 2020-07-29 NOTE — Telephone Encounter (Signed)
Patient left VM on the referrals line to r/s appt on 6/28. Let VM with patient to return call so that we can reschedule.

## 2020-07-30 ENCOUNTER — Telehealth: Payer: Self-pay | Admitting: Oncology

## 2020-07-30 NOTE — Telephone Encounter (Signed)
Patient called to r/s appt on 6/27--he states he is going out of town. Patient rescheduled and mailing AVS.

## 2020-08-02 ENCOUNTER — Inpatient Hospital Stay: Payer: Medicare Other

## 2020-08-02 ENCOUNTER — Inpatient Hospital Stay: Payer: Medicare Other | Admitting: Oncology

## 2020-08-16 ENCOUNTER — Other Ambulatory Visit: Payer: Self-pay

## 2020-08-16 ENCOUNTER — Encounter: Payer: Self-pay | Admitting: Oncology

## 2020-08-16 ENCOUNTER — Inpatient Hospital Stay (HOSPITAL_BASED_OUTPATIENT_CLINIC_OR_DEPARTMENT_OTHER): Payer: Medicare Other | Admitting: Oncology

## 2020-08-16 ENCOUNTER — Inpatient Hospital Stay: Payer: Medicare Other | Attending: Oncology

## 2020-08-16 VITALS — BP 124/65 | HR 86 | Temp 97.8°F | Resp 17 | Wt 114.0 lb

## 2020-08-16 DIAGNOSIS — E785 Hyperlipidemia, unspecified: Secondary | ICD-10-CM | POA: Diagnosis not present

## 2020-08-16 DIAGNOSIS — I129 Hypertensive chronic kidney disease with stage 1 through stage 4 chronic kidney disease, or unspecified chronic kidney disease: Secondary | ICD-10-CM | POA: Insufficient documentation

## 2020-08-16 DIAGNOSIS — E538 Deficiency of other specified B group vitamins: Secondary | ICD-10-CM | POA: Diagnosis not present

## 2020-08-16 DIAGNOSIS — D509 Iron deficiency anemia, unspecified: Secondary | ICD-10-CM | POA: Diagnosis not present

## 2020-08-16 DIAGNOSIS — N189 Chronic kidney disease, unspecified: Secondary | ICD-10-CM | POA: Diagnosis not present

## 2020-08-16 DIAGNOSIS — Z8051 Family history of malignant neoplasm of kidney: Secondary | ICD-10-CM | POA: Diagnosis not present

## 2020-08-16 LAB — CBC WITH DIFFERENTIAL/PLATELET
Abs Immature Granulocytes: 0.01 10*3/uL (ref 0.00–0.07)
Basophils Absolute: 0 10*3/uL (ref 0.0–0.1)
Basophils Relative: 0 %
Eosinophils Absolute: 0.4 10*3/uL (ref 0.0–0.5)
Eosinophils Relative: 5 %
HCT: 43.9 % (ref 39.0–52.0)
Hemoglobin: 14.8 g/dL (ref 13.0–17.0)
Immature Granulocytes: 0 %
Lymphocytes Relative: 19 %
Lymphs Abs: 1.4 10*3/uL (ref 0.7–4.0)
MCH: 29.6 pg (ref 26.0–34.0)
MCHC: 33.7 g/dL (ref 30.0–36.0)
MCV: 87.8 fL (ref 80.0–100.0)
Monocytes Absolute: 0.7 10*3/uL (ref 0.1–1.0)
Monocytes Relative: 8 %
Neutro Abs: 5.3 10*3/uL (ref 1.7–7.7)
Neutrophils Relative %: 68 %
Platelets: 265 10*3/uL (ref 150–400)
RBC: 5 MIL/uL (ref 4.22–5.81)
RDW: 13.2 % (ref 11.5–15.5)
WBC: 7.7 10*3/uL (ref 4.0–10.5)
nRBC: 0 % (ref 0.0–0.2)

## 2020-08-16 LAB — IRON AND TIBC
Iron: 99 ug/dL (ref 45–182)
Saturation Ratios: 23 % (ref 17.9–39.5)
TIBC: 431 ug/dL (ref 250–450)
UIBC: 332 ug/dL

## 2020-08-16 LAB — FERRITIN: Ferritin: 32 ng/mL (ref 24–336)

## 2020-08-16 LAB — VITAMIN B12: Vitamin B-12: 826 pg/mL (ref 180–914)

## 2020-08-16 NOTE — Progress Notes (Signed)
Patient here for oncology follow-up appointment, expresses concerns of cough and requests blood sugar lancet refill

## 2020-08-22 ENCOUNTER — Encounter: Payer: Self-pay | Admitting: Oncology

## 2020-08-22 NOTE — Progress Notes (Signed)
Hematology/Oncology Consult note [facility]  [phone] [phone]  Patient Care Team: Theotis Burrow, MD as PCP - General (Family Medicine)   Name of the patient: Ricky Jarvis  [id number]  1950-07-31   Date of visit: 08/22/20  Diagnosis- normocytic anemia likely a combination of iron and B12 deficiency as well as anemia of chronic kidney disease    Chief complaint/ Reason for visit-routine follow-up of anemia  Heme/Onc history:  patient is a 70 year old gentleman from [place] who speaks Urdu and limited [place].  I was able to converse with him in Hindi which is a language both of [place] can understand comprehend well. His past medical history significant for hypertension hyperlipidemia and chronic kidney disease.  He was referred for anemia.  Most recent CBC from 09/19/2019 showed white count of 7.4, H&H of 11.3/37.6 with an MCV of 77.8 and a platelet count of 352.  Serum creatinine elevated at 1.5.  Last iron studies checked in March 2021 showed a ferritin level of 8 and elevated TIBC.  He has seen Dr. Drucilla Schmidt in the past and has undergone colonoscopy in October 2020 but has not undergone EGD.  Colonoscopy showed 2 small polyps in the rectum and cecum which were negative for malignancy.  No active bleeding was noted at that time.  Patient currently reports ongoing fatigue.  He has lost 4 pounds in the last 1 month.  He continues to be active and works 40 hours a week.      Results of blood work from 09/29/2019 were as follows: CBC showed white count of 7.9, H&H of 11.7/37.3 with an MCV of 75.7 and a platelet count of 361.  CMP was significant for elevated creatinine of 1.5.  Folate levels were normal.  B12 level was normal at 109.  Ferritin level was low at 7 with iron studies showing elevated TIBC.  Myeloma panel showed polyclonal increase in immunoglobulins.  Haptoglobin and TSH was normal. Patient received IV iron and B12 shots in  September 2021    Interval history-patient mainly communicates with me in [place] which is a language both of [place] understand and speak.  Overall patient is doing well and reports improvement in his symptoms after receiving IV iron.  He is currently on oral iron tablets as well as B12 supplements.  He continues to work a full-time job.  Appetite and weight have remained stable.  ECOG PS- 1 Pain scale- 0   Review of systems- Review of Systems  Constitutional:  Negative for chills, fever, malaise/fatigue and weight loss.  HENT:  Negative for congestion, ear discharge and nosebleeds.   Eyes:  Negative for blurred vision.  Respiratory:  Negative for cough, hemoptysis, sputum production, shortness of breath and wheezing.   Cardiovascular:  Negative for chest pain, palpitations, orthopnea and claudication.  Gastrointestinal:  Negative for abdominal pain, blood in stool, constipation, diarrhea, heartburn, melena, nausea and vomiting.  Genitourinary:  Negative for dysuria, flank pain, frequency, hematuria and urgency.  Musculoskeletal:  Negative for back pain, joint pain and myalgias.  Skin:  Negative for rash.  Neurological:  Negative for dizziness, tingling, focal weakness, seizures, weakness and headaches.  Endo/Heme/Allergies:  Does not bruise/bleed easily.  Psychiatric/Behavioral:  Negative for depression and suicidal ideas. The patient does not have insomnia.       Allergies  Allergen Reactions   Pork-Derived Products Anaphylaxis   Azithromycin     Other reaction(s): Other (See Comments)  Pt states that he has hiccups  when he takes this medication   Ibuprofen      Past Medical History:  Diagnosis Date   Anemia    Arthritis    Cancer ([place])    Diabetes ([place]) 03/26/2013   GERD (gastroesophageal reflux disease) 03/26/2013   Gout    History of hematuria    HTN (hypertension) 03/26/2013   Renal insufficiency      Past Surgical History:  Procedure Laterality Date   APPENDECTOMY      COLONOSCOPY WITH PROPOFOL N/A 11/18/2018   Procedure: COLONOSCOPY WITH PROPOFOL;  Surgeon: Lin Landsman, MD;  Location: [place];  Service: Gastroenterology;  Laterality: N/A;   ROBOTIC ASSITED PARTIAL NEPHRECTOMY Left 08/29/2016   Procedure: ROBOTIC ASSITED PARTIAL NEPHRECTOMY;  Surgeon: Hollice Espy, MD;  Location: [facility];  Service: Urology;  Laterality: Left;   UPPER GI ENDOSCOPY      Social History   Socioeconomic History   Marital status: Married    Spouse name: Not on file   Number of children: Not on file   Years of education: Not on file   Highest education level: Not on file  Occupational History   Not on file  Tobacco Use   Smoking status: Never   Smokeless tobacco: Never  Vaping Use   Vaping Use: Never used  Substance and Sexual Activity   Alcohol use: No   Drug use: No   Sexual activity: Not Currently  Other Topics Concern   Not on file  Social History Narrative   Not on file   Social Determinants of Health   Financial Resource Strain: Not on file  Food Insecurity: Not on file  Transportation Needs: Not on file  Physical Activity: Not on file  Stress: Not on file  Social Connections: Not on file  Intimate Partner Violence: Not on file    Family History  Problem Relation Age of Onset   Kidney cancer Mother    Prostate cancer Neg Hx    Bladder Cancer Neg Hx      Current Outpatient Medications:    ALLERGY RELIEF 10 MG tablet, Take 10 mg by mouth daily., Disp: , Rfl:    amLODipine (NORVASC) 5 MG tablet, TAKE 1 TABLET BY MOUTH DAILY, Disp: 90 tablet, Rfl: 3   benazepril (LOTENSIN) 20 MG tablet, Take 1 tablet (20 mg total) by mouth at bedtime., Disp: 90 tablet, Rfl: 3   cholecalciferol (VITAMIN D) 1000 units tablet, Take 1,000 Units by mouth daily., Disp: , Rfl:    FLUoxetine (PROZAC) 10 MG capsule, Take 10 mg by mouth daily., Disp: , Rfl:    JANUMET 50-500 MG tablet, TAKE 1 TABLET BY MOUTH TWICE DAILY, Disp: 180 tablet, Rfl: 3    omeprazole (PRILOSEC) 20 MG capsule, Take 20 mg by mouth. , Disp: , Rfl:    rosuvastatin (CRESTOR) 20 MG tablet, Take 1 tablet by mouth once daily, Disp: 90 tablet, Rfl: 0   lovastatin (MEVACOR) 20 MG tablet, Take 20 mg by mouth at bedtime.  (Patient not taking: Reported on 08/16/2020), Disp: , Rfl:   Physical exam:  Vitals:   08/16/20 1400  BP: 124/65  Pulse: 86  Resp: 17  Temp: 97.8 F (36.6 C)  TempSrc: Tympanic  SpO2: 98%  Weight: 114 lb (51.7 kg)   Physical Exam Constitutional:      General: He is not in acute distress. Cardiovascular:     Rate and Rhythm: Normal rate and regular rhythm.     Heart sounds: Normal heart sounds.  Pulmonary:  Effort: Pulmonary effort is normal.     Breath sounds: Normal breath sounds.  Abdominal:     General: Bowel sounds are normal.     Palpations: Abdomen is soft.  Skin:    General: Skin is warm and dry.  Neurological:     Mental Status: He is alert and oriented to person, place, and time.     CMP Latest Ref Rng & Units 03/12/2020  Glucose 70 - 99 mg/dL -  BUN 8 - 23 mg/dL -  Creatinine 0.61 - 1.24 mg/dL 1.60(H)  Sodium 135 - 145 mmol/L -  Potassium 3.5 - 5.1 mmol/L -  Chloride 98 - 111 mmol/L -  CO2 22 - 32 mmol/L -  Calcium 8.9 - 10.3 mg/dL -  Total Protein 6.5 - 8.1 g/dL -  Total Bilirubin 0.3 - 1.2 mg/dL -  Alkaline Phos 38 - 126 U/L -  AST 15 - 41 U/L -  ALT 0 - 44 U/L -   CBC Latest Ref Rng & Units 08/16/2020  WBC 4.0 - 10.5 K/uL 7.7  Hemoglobin 13.0 - 17.0 g/dL 14.8  Hematocrit 39.0 - 52.0 % 43.9  Platelets 150 - 400 K/uL 265   Assessment and plan- Patient is a 70 y.o. male with history of iron B12 deficiency anemia here for routine follow-up  Patient's hemoglobin is improved significantly from 11.7 in August 20 21-14.8 presently.  Iron studies are within normal limits with a ferritin of 32 B12 level of 826 and iron saturation of 23%.  Patient will continue to take oral iron and B12 supplements.  Repeat labs in 4  months and 8 months and I will see him back in 8 months   Visit Diagnosis 1. Iron deficiency anemia, unspecified iron deficiency anemia type   2. B12 deficiency      Dr. Randa Evens, MD, MPH [facility] [phone] 08/22/2020 1:21 PM

## 2020-08-30 DIAGNOSIS — I10 Essential (primary) hypertension: Secondary | ICD-10-CM | POA: Diagnosis not present

## 2020-08-30 DIAGNOSIS — E785 Hyperlipidemia, unspecified: Secondary | ICD-10-CM | POA: Diagnosis not present

## 2020-09-08 ENCOUNTER — Ambulatory Visit (INDEPENDENT_AMBULATORY_CARE_PROVIDER_SITE_OTHER): Payer: Medicare Other | Admitting: Internal Medicine

## 2020-09-08 ENCOUNTER — Other Ambulatory Visit: Payer: Self-pay

## 2020-09-08 ENCOUNTER — Encounter: Payer: Self-pay | Admitting: Internal Medicine

## 2020-09-08 VITALS — BP 158/87 | HR 93 | Ht 64.0 in | Wt 114.0 lb

## 2020-09-08 DIAGNOSIS — E1129 Type 2 diabetes mellitus with other diabetic kidney complication: Secondary | ICD-10-CM | POA: Diagnosis not present

## 2020-09-08 DIAGNOSIS — K219 Gastro-esophageal reflux disease without esophagitis: Secondary | ICD-10-CM

## 2020-09-08 DIAGNOSIS — I150 Renovascular hypertension: Secondary | ICD-10-CM | POA: Diagnosis not present

## 2020-09-08 DIAGNOSIS — R42 Dizziness and giddiness: Secondary | ICD-10-CM

## 2020-09-08 DIAGNOSIS — R809 Proteinuria, unspecified: Secondary | ICD-10-CM | POA: Diagnosis not present

## 2020-09-08 NOTE — Assessment & Plan Note (Signed)
-   The patient's GERD is stable on medication.  - Instructed the patient to avoid eating spicy and acidic foods, as well as foods high in fat. - Instructed the patient to avoid eating large meals or meals 2-3 hours prior to sleeping. 

## 2020-09-08 NOTE — Assessment & Plan Note (Signed)

## 2020-09-08 NOTE — Assessment & Plan Note (Signed)
Patient is not dizzy anymore he was able to drive the car.  Neurological examination is normal.  Patient was advised to take Claritin as needed 5 mg.  Heart is regular chest is clear EKG did not show any acute changes.  Deep tendon reflexes are 2+

## 2020-09-08 NOTE — Progress Notes (Signed)
Established Patient Office Visit  Subjective:  Patient ID: Ricky Jarvis, male    DOB: Aug 07, 1950  Age: 70 y.o. MRN: [id number]  CC:  Chief Complaint  Patient presents with   Dizziness    Dizziness   [name] presents for dizziness, denies any chest pain fluttering heart continue present hospital there is no seizure disorder.  He does not smoke does not drink.,  He has been working outside in the heat.  Past Medical History:  Diagnosis Date   Anemia    Arthritis    Cancer ([place])    Diabetes ([place]) 03/26/2013   GERD (gastroesophageal reflux disease) 03/26/2013   Gout    History of hematuria    HTN (hypertension) 03/26/2013   Renal insufficiency     Past Surgical History:  Procedure Laterality Date   APPENDECTOMY     COLONOSCOPY WITH PROPOFOL N/A 11/18/2018   Procedure: COLONOSCOPY WITH PROPOFOL;  Surgeon: Lin Landsman, MD;  Location: [facility] ENDOSCOPY;  Service: Gastroenterology;  Laterality: N/A;   ROBOTIC ASSITED PARTIAL NEPHRECTOMY Left 08/29/2016   Procedure: ROBOTIC ASSITED PARTIAL NEPHRECTOMY;  Surgeon: Hollice Espy, MD;  Location: [facility] ORS;  Service: Urology;  Laterality: Left;   UPPER GI ENDOSCOPY      Family History  Problem Relation Age of Onset   Kidney cancer Mother    Prostate cancer Neg Hx    Bladder Cancer Neg Hx     Social History   Socioeconomic History   Marital status: Married    Spouse name: Not on file   Number of children: Not on file   Years of education: Not on file   Highest education level: Not on file  Occupational History   Not on file  Tobacco Use   Smoking status: Never   Smokeless tobacco: Never  Vaping Use   Vaping Use: Never used  Substance and Sexual Activity   Alcohol use: No   Drug use: No   Sexual activity: Not Currently  Other Topics Concern   Not on file  Social History Narrative   Not on file   Social Determinants of Health   Financial Resource Strain: Not on file  Food Insecurity: Not on file   Transportation Needs: Not on file  Physical Activity: Not on file  Stress: Not on file  Social Connections: Not on file  Intimate Partner Violence: Not on file     Current Outpatient Medications:    ALLERGY RELIEF 10 MG tablet, Take 10 mg by mouth daily., Disp: , Rfl:    amLODipine (NORVASC) 5 MG tablet, TAKE 1 TABLET BY MOUTH DAILY, Disp: 90 tablet, Rfl: 3   benazepril (LOTENSIN) 20 MG tablet, Take 1 tablet (20 mg total) by mouth at bedtime., Disp: 90 tablet, Rfl: 3   cholecalciferol (VITAMIN D) 1000 units tablet, Take 1,000 Units by mouth daily., Disp: , Rfl:    FLUoxetine (PROZAC) 10 MG capsule, Take 10 mg by mouth daily., Disp: , Rfl:    JANUMET 50-500 MG tablet, TAKE 1 TABLET BY MOUTH TWICE DAILY, Disp: 180 tablet, Rfl: 3   lovastatin (MEVACOR) 20 MG tablet, Take 20 mg by mouth at bedtime.  (Patient not taking: Reported on 08/16/2020), Disp: , Rfl:    omeprazole (PRILOSEC) 20 MG capsule, Take 20 mg by mouth. , Disp: , Rfl:    rosuvastatin (CRESTOR) 20 MG tablet, Take 1 tablet by mouth once daily, Disp: 90 tablet, Rfl: 0   Allergies  Allergen Reactions   Pork-Derived Products Anaphylaxis  Azithromycin     Other reaction(s): Other (See Comments)  Pt states that he has hiccups when he takes this medication   Ibuprofen     ROS Review of Systems  Constitutional: Negative.   HENT: Negative.    Eyes: Negative.   Respiratory: Negative.    Cardiovascular: Negative.   Gastrointestinal: Negative.   Endocrine: Negative.   Genitourinary: Negative.   Musculoskeletal: Negative.   Skin: Negative.   Allergic/Immunologic: Negative.   Neurological:  Positive for dizziness.  Hematological: Negative.   Psychiatric/Behavioral: Negative.    All other systems reviewed and are negative.    Objective:    Physical Exam Vitals reviewed.  Constitutional:      Appearance: Normal appearance.  HENT:     Mouth/Throat:     Mouth: Mucous membranes are moist.  Eyes:     Pupils: Pupils are  equal, round, and reactive to light.  Neck:     Vascular: No carotid bruit.  Cardiovascular:     Rate and Rhythm: Normal rate and regular rhythm.     Pulses: Normal pulses.     Heart sounds: Normal heart sounds.  Pulmonary:     Effort: Pulmonary effort is normal.     Breath sounds: Normal breath sounds.  Abdominal:     General: Bowel sounds are normal.     Palpations: Abdomen is soft. There is no hepatomegaly, splenomegaly or mass.     Tenderness: There is no abdominal tenderness.     Hernia: No hernia is present.  Musculoskeletal:     Cervical back: Neck supple.     Right lower leg: No edema.     Left lower leg: No edema.  Skin:    Findings: No rash.  Neurological:     Mental Status: He is alert and oriented to person, place, and time.     Motor: No weakness.  Psychiatric:        Mood and Affect: Mood normal.        Behavior: Behavior normal.    BP (!) 158/87   Pulse 93   Ht '5\' 4"'$  (1.626 m)   Wt 114 lb (51.7 kg)   BMI 19.57 kg/m  Wt Readings from Last 3 Encounters:  09/08/20 114 lb (51.7 kg)  08/16/20 114 lb (51.7 kg)  03/16/20 114 lb (51.7 kg)     Health Maintenance Due  Topic Date Due   HEMOGLOBIN A1C  Never done   COVID-19 Vaccine (1) Never done   FOOT EXAM  Never done   OPHTHALMOLOGY EXAM  Never done   Hepatitis C Screening  Never done   PNA vac Low Risk Adult (2 of 2 - PPSV23) 10/01/2019   INFLUENZA VACCINE  09/06/2020    There are no preventive care reminders to display for this patient.  Lab Results  Component Value Date   TSH 1.010 09/29/2019   Lab Results  Component Value Date   WBC 7.7 08/16/2020   HGB 14.8 08/16/2020   HCT 43.9 08/16/2020   MCV 87.8 08/16/2020   PLT 265 08/16/2020   Lab Results  Component Value Date   NA 137 01/12/2020   K 4.2 01/12/2020   CO2 29 01/12/2020   GLUCOSE 103 (H) 01/12/2020   BUN 23 01/12/2020   CREATININE 1.60 (H) 03/12/2020   BILITOT 0.6 09/29/2019   ALKPHOS 54 09/29/2019   AST 13 (L) 09/29/2019    ALT 13 09/29/2019   PROT 8.3 (H) 09/29/2019   ALBUMIN 4.1 09/29/2019   CALCIUM  8.9 01/12/2020   ANIONGAP 7 01/12/2020   No results found for: CHOL No results found for: HDL No results found for: LDLCALC No results found for: TRIG No results found for: CHOLHDL No results found for: HGBA1C    Assessment & Plan:   Problem List Items Addressed This Visit       Cardiovascular and Mediastinum   HTN (hypertension)     Patient denies any chest pain or shortness of breath there is no history of palpitation or paroxysmal nocturnal dyspnea   patient was advised to follow low-salt low-cholesterol diet    ideally I want to keep systolic blood pressure below 130 mmHg, patient was asked to check blood pressure one times a week and give me a report on that.  Patient will be follow-up in 3 months  or earlier as needed, patient will call me back for any change in the cardiovascular symptoms Patient was advised to buy a book from local bookstore concerning blood pressure and read several chapters  every day.  This will be supplemented by some of the material we will give him from the office.  Patient should also utilize other resources like YouTube and Internet to learn more about the blood pressure and the diet.         Digestive   GERD (gastroesophageal reflux disease)    - The patient's GERD is stable on medication.  - Instructed the patient to avoid eating spicy and acidic foods, as well as foods high in fat. - Instructed the patient to avoid eating large meals or meals 2-3 hours prior to sleeping.         Endocrine   Diabetes ([place])    - The patient's blood sugar is labile on med. - The patient will continue the current treatment regimen.  - I encouraged the patient to regularly check blood sugar.  - I encouraged the patient to monitor diet. I encouraged the patient to eat low-carb and low-sugar to help prevent blood sugar spikes.  - I encouraged the patient to continue following their  prescribed treatment plan for diabetes - I informed the patient to get help if blood sugar drops below '54mg'$ /dL, or if suddenly have trouble thinking clearly or breathing.  Patient was advised to buy a book on diabetes from a local bookstore or from [place]  Patient should read 2 chapters every day to keep the motivation going, this is in addition to some of the materials we provided them from the office.  There are other resources on the Internet like YouTube and wilkipedia to get an education on the diabetes         Other   Vertigo    Patient is not dizzy anymore he was able to drive the car.  Neurological examination is normal.  Patient was advised to take Claritin as needed 5 mg.  Heart is regular chest is clear EKG did not show any acute changes.  Deep tendon reflexes are 2+       Other Visit Diagnoses     Dizziness    -  Primary   Relevant Orders   EKG 12-Lead       No orders of the defined types were placed in this encounter. Report electrocardiogram. Electrocardiogram does not show any acute changes.  COVID test is negative.  Blood sugar is normal.  169  Follow-up: 2 wks    Cletis Athens, MD

## 2020-09-08 NOTE — Assessment & Plan Note (Signed)

## 2020-09-13 DIAGNOSIS — Z0000 Encounter for general adult medical examination without abnormal findings: Secondary | ICD-10-CM | POA: Diagnosis not present

## 2020-09-13 DIAGNOSIS — Z1389 Encounter for screening for other disorder: Secondary | ICD-10-CM | POA: Diagnosis not present

## 2020-09-13 DIAGNOSIS — E119 Type 2 diabetes mellitus without complications: Secondary | ICD-10-CM | POA: Diagnosis not present

## 2020-10-04 ENCOUNTER — Other Ambulatory Visit: Payer: Self-pay | Admitting: Internal Medicine

## 2020-10-15 IMAGING — CT CT ABD-PELV W/ CM
2 of 5 series · 16 of 46 positions shown, 18 images · IV contrast (APPLIED)
Comparison: 12/16/2018

CLINICAL DATA: History of left renal cell carcinoma with partial
nephrectomy. Routine follow-up.

EXAM:
CT ABDOMEN AND PELVIS WITH CONTRAST
TECHNIQUE: Multidetector CT imaging of the abdomen and pelvis was performed
using the standard protocol following bolus administration of
intravenous contrast.
CONTRAST:  75mL OMNIPAQUE IOHEXOL 300 MG/ML  SOLN

[Series 2: routine abd/pel with · axial · 0.68mm/px · z∈[-429,-59]mm · 13 of 84 slices shown, 15 images]
[im 5/84  soft-tissue]
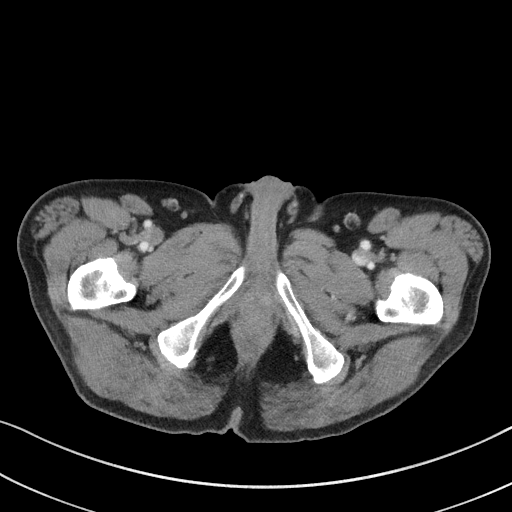
[im 5/84  bone]
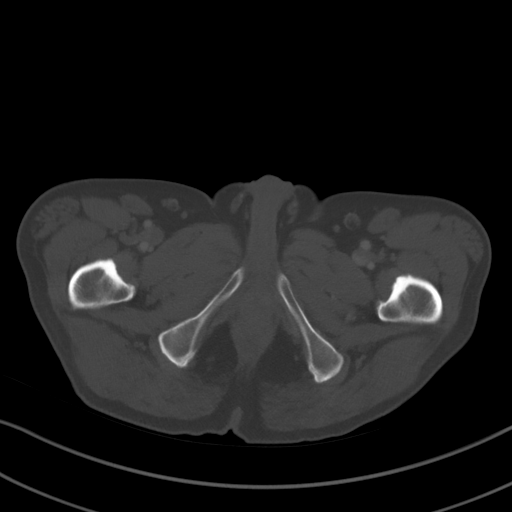
[im 14/84  soft-tissue]
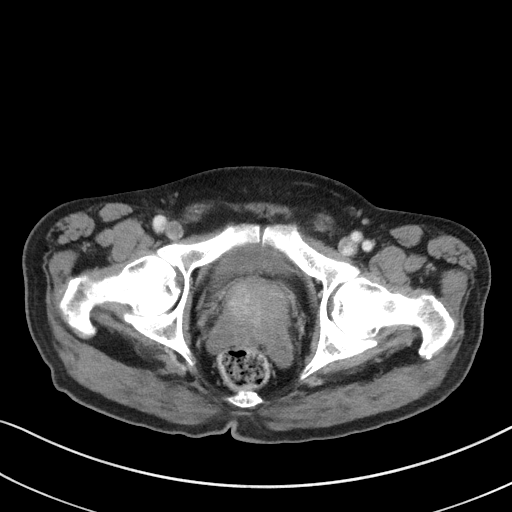
[im 18/84  soft-tissue]
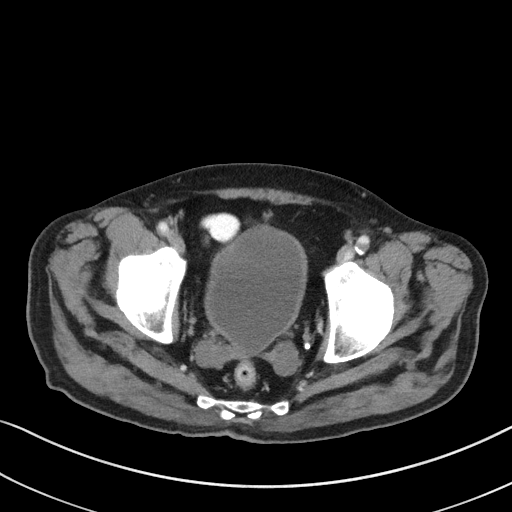
[im 22/84  soft-tissue]
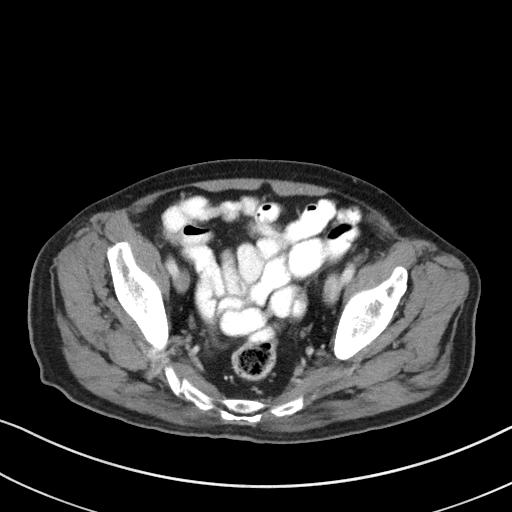
[im 31/84  soft-tissue]
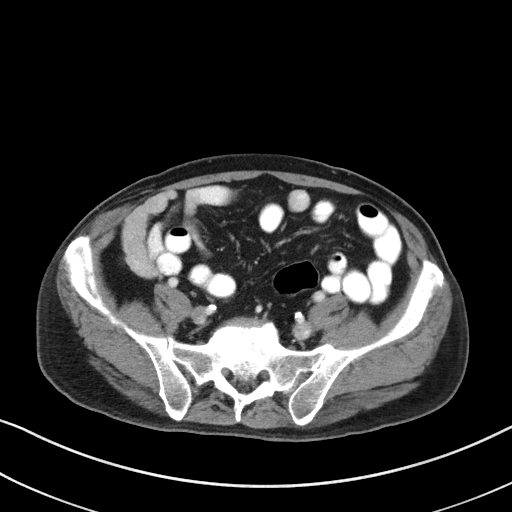
[im 35/84  soft-tissue]
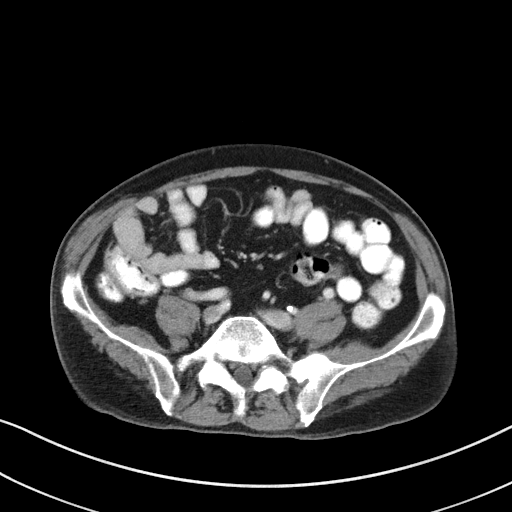
[im 44/84  soft-tissue]
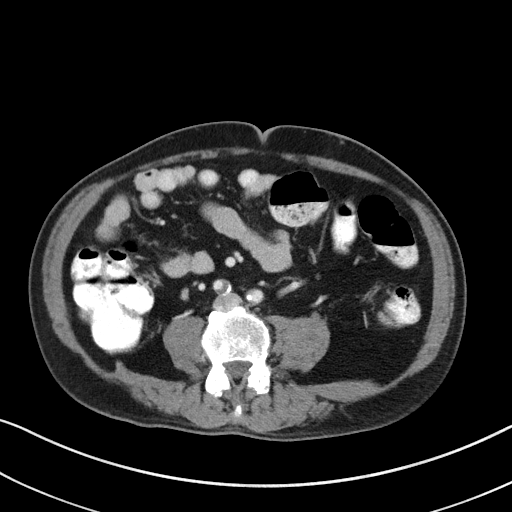
[im 49/84  soft-tissue]
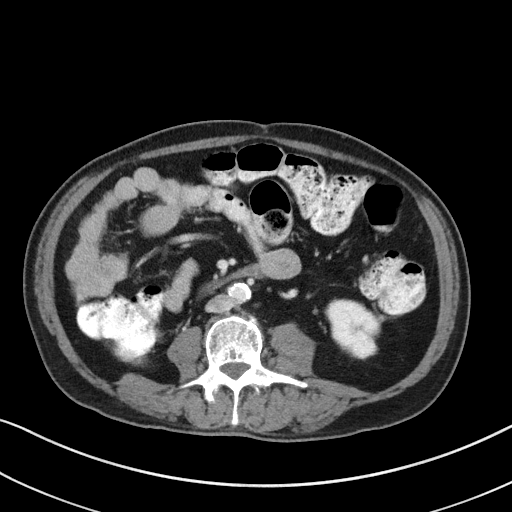
[im 53/84  soft-tissue]
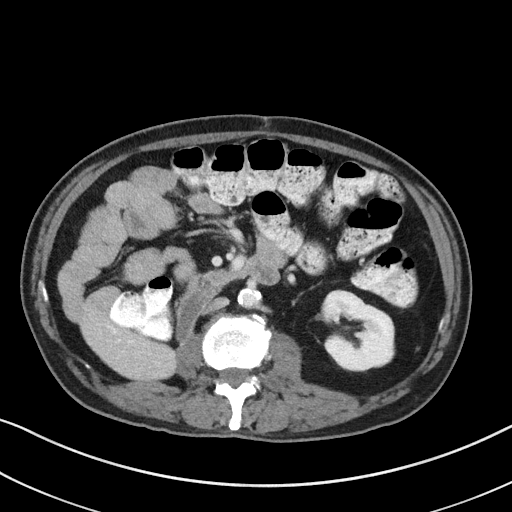
[im 53/84  bone]
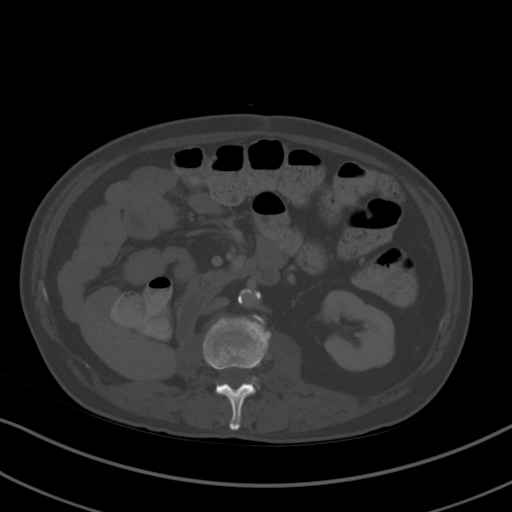
[im 62/84  soft-tissue]
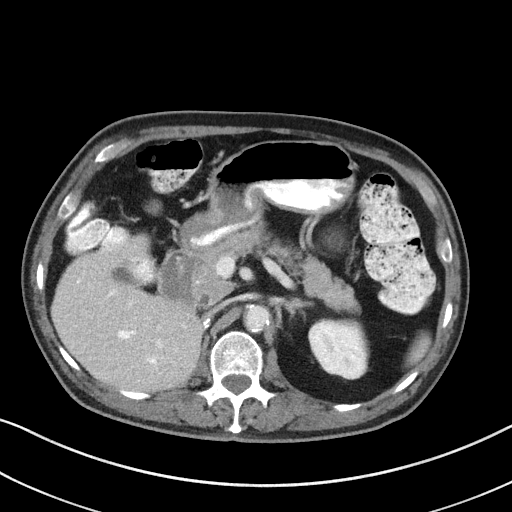
[im 66/84  soft-tissue]
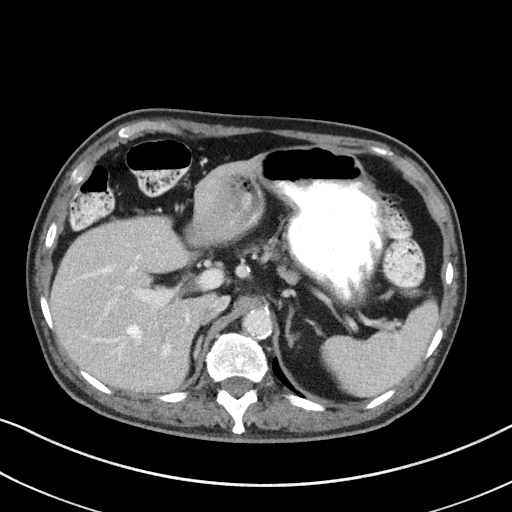
[im 70/84  soft-tissue]
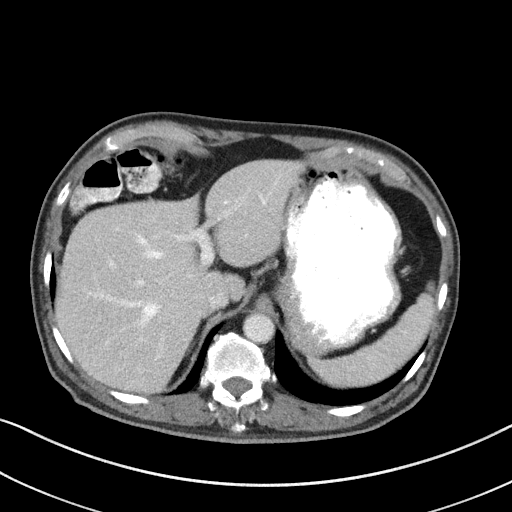
[im 79/84  soft-tissue]
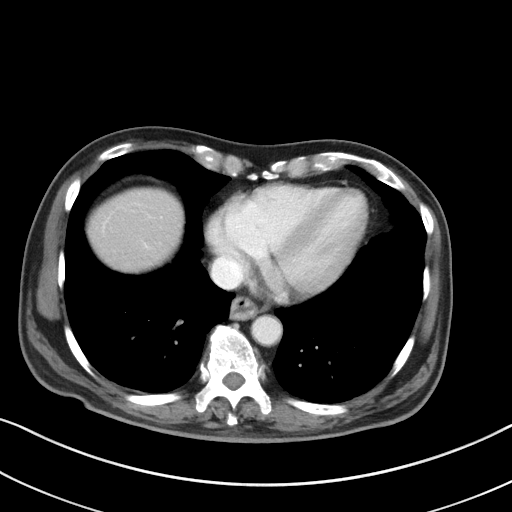

[Series 5: coronal st · coronal · 0.64mm/px · 3 of 77 slices shown]
[im 26/77  soft-tissue]
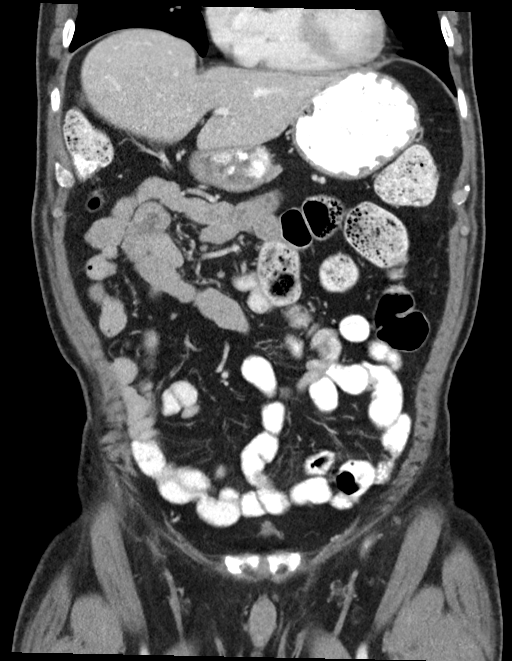
[im 34/77  soft-tissue]
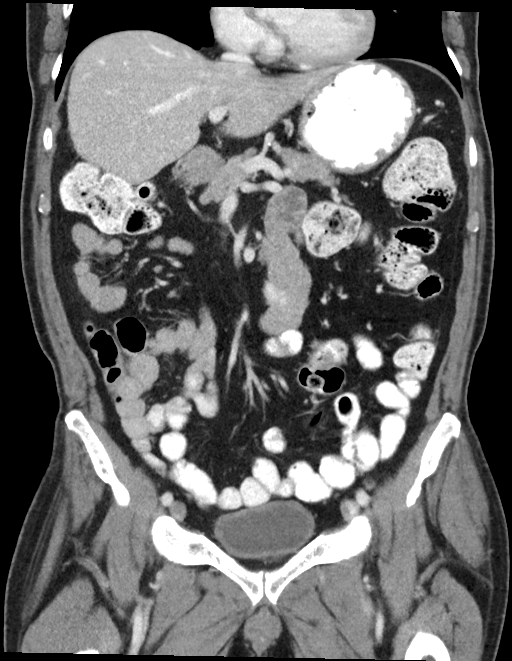
[im 43/77  soft-tissue]
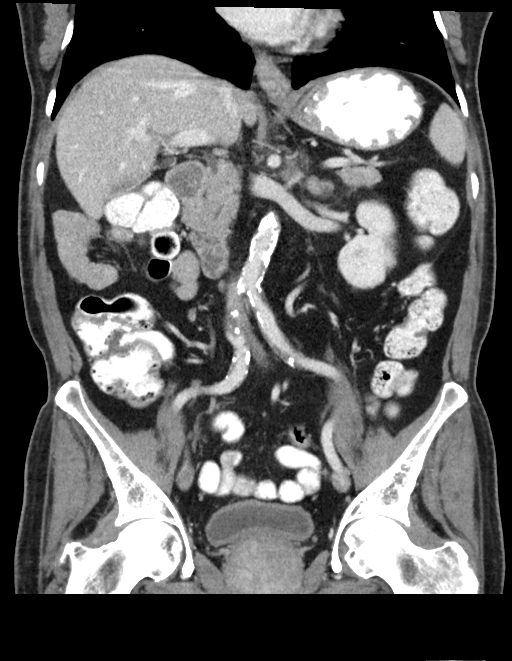

[16 of 46 positions shown; findings below may reference images not displayed]

FINDINGS: Lower chest: No acute abnormality.

Hepatobiliary: No focal liver abnormality is seen. No gallstones,
gallbladder wall thickening, or biliary dilatation.

Pancreas: Unremarkable. No pancreatic ductal dilatation or
surrounding inflammatory changes.

Spleen: Normal in size without focal abnormality.

Adrenals/Urinary Tract: Normal appearance of the adrenal glands.
Status post right nephrectomy.

Postop change from partial nephrectomy are noted involving the
inferior pole of the left kidney. This appears stable when compared
with the previous exam. No new suspicious enhancing mass identified
within the left kidney.

Stomach/Bowel: Stomach is within normal limits. No evidence of bowel
wall thickening, distention, or inflammatory changes.

Vascular/Lymphatic: Aortic atherosclerosis. No aneurysm. No
abdominal adenopathy identified. No pelvic or inguinal adenopathy.

Reproductive: Prostate gland is enlarged.

Other: There is no free fluid or fluid collections identified.

Musculoskeletal: Mild degenerative disc disease identified within
the visualized thoracolumbar spine. No aggressive lytic or sclerotic
bone lesions identified.
IMPRESSION: 1. Stable postsurgical change from partial nephrectomy involving the
inferior pole of the left kidney. No specific findings identified to
suggest residual or recurrence of tumor.
2. Status post right nephrectomy.
3. Aortic atherosclerosis.
4. Enlarged prostate gland.

Aortic Atherosclerosis ([id number]).

## 2020-10-21 DIAGNOSIS — M5417 Radiculopathy, lumbosacral region: Secondary | ICD-10-CM | POA: Diagnosis not present

## 2020-11-08 ENCOUNTER — Ambulatory Visit: Payer: Medicare Other | Admitting: Internal Medicine

## 2020-12-20 ENCOUNTER — Inpatient Hospital Stay: Payer: Medicare Other | Attending: Oncology

## 2020-12-20 ENCOUNTER — Other Ambulatory Visit: Payer: Self-pay

## 2020-12-20 DIAGNOSIS — I129 Hypertensive chronic kidney disease with stage 1 through stage 4 chronic kidney disease, or unspecified chronic kidney disease: Secondary | ICD-10-CM | POA: Diagnosis not present

## 2020-12-20 DIAGNOSIS — E785 Hyperlipidemia, unspecified: Secondary | ICD-10-CM | POA: Insufficient documentation

## 2020-12-20 DIAGNOSIS — E538 Deficiency of other specified B group vitamins: Secondary | ICD-10-CM | POA: Diagnosis not present

## 2020-12-20 DIAGNOSIS — N189 Chronic kidney disease, unspecified: Secondary | ICD-10-CM | POA: Insufficient documentation

## 2020-12-20 DIAGNOSIS — D509 Iron deficiency anemia, unspecified: Secondary | ICD-10-CM | POA: Diagnosis not present

## 2020-12-20 DIAGNOSIS — Z8051 Family history of malignant neoplasm of kidney: Secondary | ICD-10-CM | POA: Diagnosis not present

## 2020-12-20 LAB — CBC WITH DIFFERENTIAL/PLATELET
Abs Immature Granulocytes: 0.02 10*3/uL (ref 0.00–0.07)
Basophils Absolute: 0.1 10*3/uL (ref 0.0–0.1)
Basophils Relative: 1 %
Eosinophils Absolute: 0.4 10*3/uL (ref 0.0–0.5)
Eosinophils Relative: 5 %
HCT: 47.4 % (ref 39.0–52.0)
Hemoglobin: 15.6 g/dL (ref 13.0–17.0)
Immature Granulocytes: 0 %
Lymphocytes Relative: 19 %
Lymphs Abs: 1.4 10*3/uL (ref 0.7–4.0)
MCH: 29.7 pg (ref 26.0–34.0)
MCHC: 32.9 g/dL (ref 30.0–36.0)
MCV: 90.1 fL (ref 80.0–100.0)
Monocytes Absolute: 0.6 10*3/uL (ref 0.1–1.0)
Monocytes Relative: 9 %
Neutro Abs: 4.7 10*3/uL (ref 1.7–7.7)
Neutrophils Relative %: 66 %
Platelets: 290 10*3/uL (ref 150–400)
RBC: 5.26 MIL/uL (ref 4.22–5.81)
RDW: 12.5 % (ref 11.5–15.5)
WBC: 7.2 10*3/uL (ref 4.0–10.5)
nRBC: 0 % (ref 0.0–0.2)

## 2020-12-20 LAB — IRON AND TIBC
Iron: 90 ug/dL (ref 45–182)
Saturation Ratios: 19 % (ref 17.9–39.5)
TIBC: 463 ug/dL — ABNORMAL HIGH (ref 250–450)
UIBC: 373 ug/dL

## 2020-12-20 LAB — FOLATE: Folate: 7 ng/mL (ref >5.9)

## 2020-12-20 LAB — VITAMIN B12: Vitamin B-12: 690 pg/mL (ref 180–914)

## 2020-12-20 LAB — FERRITIN: Ferritin: 27 ng/mL (ref 24–336)

## 2021-01-03 ENCOUNTER — Other Ambulatory Visit: Payer: Self-pay | Admitting: Internal Medicine

## 2021-01-08 ENCOUNTER — Other Ambulatory Visit: Payer: Self-pay | Admitting: Internal Medicine

## 2021-01-20 ENCOUNTER — Ambulatory Visit (INDEPENDENT_AMBULATORY_CARE_PROVIDER_SITE_OTHER): Payer: Medicare Other | Admitting: *Deleted

## 2021-01-20 DIAGNOSIS — Z0000 Encounter for general adult medical examination without abnormal findings: Secondary | ICD-10-CM

## 2021-01-20 NOTE — Progress Notes (Signed)
Subjective:   Mont Jagoda is a 70 y.o. male who presents for an Initial Medicare Annual Wellness Visit.  I discussed the limitations of evaluation and management by telemedicine and the availability of in person appointments. Patient expressed understanding and agreed to proceed.   Visit performed using audio  Patient:home Provider:home   Review of Systems    Defer to provider Cardiac Risk Factors include: advanced age (>63men, >74 women);diabetes mellitus;hypertension;male gender     Objective:    There were no vitals filed for this visit. There is no height or weight on file to calculate BMI.  Advanced Directives 01/20/2021 01/12/2020 10/09/2019 11/18/2018 08/29/2016 08/15/2016  Does Patient Have a Medical Advance Directive? No No No No No No  Would patient like information on creating a medical advance directive? No - Patient declined No - Patient declined No - Patient declined No - Patient declined No - Patient declined No - Patient declined    Current Medications (verified) Outpatient Encounter Medications as of 01/20/2021  Medication Sig   ALLERGY RELIEF 10 MG tablet Take 10 mg by mouth daily.   amLODipine (NORVASC) 5 MG tablet TAKE 1 TABLET BY MOUTH DAILY   benazepril (LOTENSIN) 20 MG tablet TAKE 1 TABLET BY MOUTH DAILY AT BEDTIME   cholecalciferol (VITAMIN D) 1000 units tablet Take 1,000 Units by mouth daily.   FLUoxetine (PROZAC) 10 MG capsule Take 10 mg by mouth daily.   JANUMET 50-500 MG tablet TAKE 1 TABLET BY MOUTH TWICE DAILY   lovastatin (MEVACOR) 20 MG tablet Take 20 mg by mouth at bedtime.   omeprazole (PRILOSEC) 20 MG capsule Take 20 mg by mouth.    rosuvastatin (CRESTOR) 20 MG tablet Take 1 tablet by mouth once daily   No facility-administered encounter medications on file as of 01/20/2021.    Allergies (verified) Pork-derived products, Azithromycin, and Ibuprofen   History: Past Medical History:  Diagnosis Date   Anemia    Arthritis    Cancer ([place])     Diabetes ([place]) 03/26/2013   GERD (gastroesophageal reflux disease) 03/26/2013   Gout    History of hematuria    HTN (hypertension) 03/26/2013   Renal insufficiency    Past Surgical History:  Procedure Laterality Date   APPENDECTOMY     COLONOSCOPY WITH PROPOFOL N/A 11/18/2018   Procedure: COLONOSCOPY WITH PROPOFOL;  Surgeon: Lin Landsman, MD;  Location: [facility] END[facility]COPY;  Service: Gastroenterology;  Laterality: N/A;   ROBOTIC ASSITED PARTIAL NEPHRECTOMY Left 08/29/2016   Procedure: ROBOTIC ASSITED PARTIAL NEPHRECTOMY;  Surgeon: Hollice Espy, MD;  Location: [facility] ORS;  Service: Urology;  Laterality: Left;   UPPER GI ENDOSCOPY     Family History  Problem Relation Age of Onset   Kidney cancer Mother    Prostate cancer Neg Hx    Bladder Cancer Neg Hx    Social History   Socioeconomic History   Marital status: Married    Spouse name: Not on file   Number of children: Not on file   Years of education: Not on file   Highest education level: Not on file  Occupational History   Not on file  Tobacco Use   Smoking status: Never   Smokeless tobacco: Never  Vaping Use   Vaping Use: Never used  Substance and Sexual Activity   Alcohol use: No   Drug use: No   Sexual activity: Not Currently  Other Topics Concern   Not on file  Social History Narrative   Not on file  Social Determinants of Health   Financial Resource Strain: Low Risk    Difficulty of Paying Living Expenses: Not hard at all  Food Insecurity: No Food Insecurity   Worried About Charity fundraiser in the Last Year: Never true   [place] in the Last Year: Never true  Transportation Needs: No Transportation Needs   Lack of Transportation (Medical): No   Lack of Transportation (Non-Medical): No  Physical Activity: Insufficiently Active   Days of Exercise per Week: 3 days   Minutes of Exercise per Session: 30 min  Stress: No Stress Concern Present   Feeling of Stress : Not at all  Social  Connections: Moderately Integrated   Frequency of Communication with Friends and Family: More than three times a week   Frequency of Social Gatherings with Friends and Family: More than three times a week   Attends Religious Services: More than 4 times per year   Active Member of [employer] or Organizations: No   Attends Music therapist: Never   Marital Status: Married    Tobacco Counseling Counseling given: Not Answered   Clinical Intake:  Pre-visit preparation completed: No  Pain : No/denies pain     Nutritional Risks: None Diabetes: Yes CBG done?: No Did pt. bring in CBG monitor from home?: No  How often do you need to have someone help you when you read instructions, pamphlets, or other written materials from your doctor or pharmacy?: 2 - Rarely What is the last grade level you completed in school?: college  Diabetic?yes  Interpreter Needed?: No (some language barriers)  Information entered by :: Lacretia Nicks, [place]   Activities of Daily Living In your present state of health, do you have any difficulty performing the following activities: 01/20/2021  Hearing? N  Vision? N  Difficulty concentrating or making decisions? N  Walking or climbing stairs? N  Dressing or bathing? N  Doing errands, shopping? N  Preparing Food and eating ? N  Using the Toilet? N  In the past six months, have you accidently leaked urine? N  Do you have problems with loss of bowel control? N  Managing your Medications? N  Managing your Finances? N  Some recent data might be hidden    Patient Care Team: Cletis Athens, MD as PCP - General (Internal Medicine)  Indicate any recent Medical Services you may have received from other than [facility] providers in the past year (date may be approximate).     Assessment:   This is a routine wellness examination for [place].  Hearing/Vision screen No results found.  Dietary issues and exercise activities discussed: Current Exercise  Habits: Home exercise routine, Type of exercise: walking;Other - see comments (doing things around the house), Time (Minutes): 30, Frequency (Times/Week): 3, Weekly Exercise (Minutes/Week): 90, Intensity: Mild, Exercise limited by: None identified   Goals Addressed   None    Depression Screen PHQ 2/9 Scores 01/20/2021 01/20/2021  PHQ - 2 Score 0 0    Fall Risk Fall Risk  01/20/2021  Falls in the past year? 0  Number falls in past yr: 0  Injury with Fall? 0  Risk for fall due to : No Fall Risks  Follow up Falls evaluation completed    [place]:  Any stairs in or around the home? Yes  If so, are there any without handrails? No  Home free of loose throw rugs in walkways, pet beds, electrical cords, etc? yes Adequate  lighting in your home to reduce risk of falls? yes  ASSISTIVE DEVICES UTILIZED TO PREVENT FALLS:  Life alert? No  Use of a cane, walker or w/c? no Grab bars in the bathroom? No  Shower chair or bench in shower? No  Elevated toilet seat or a handicapped toilet? No   TIMED UP AND GO:  Was the test performed? No .   Cognitive Function: MMSE - Mini Mental State Exam 01/20/2021  Not completed: Unable to complete     6CIT Screen 01/20/2021  What Year? 0 points  What month? 0 points  What time? 0 points  Count back from 20 0 points  Months in reverse 0 points  Repeat phrase 0 points  Total Score 0    Immunizations Immunization History  Administered Date(s) Administered   Hepatitis A, Adult 02/11/2014   IPV 02/11/2014   Influenza, Seasonal, Injecte, Preservative Fre 01/02/2012   Influenza,inj,Quad PF,6+ Mos 11/25/2015, 02/02/2017   Influenza,inj,quad, With Preservative 11/07/2015   Influenza-Unspecified 02/12/2017, 01/18/2021   Meningococcal Conjugate 02/11/2014   Pneumococcal Conjugate-13 10/01/2018   Pneumococcal-Unspecified 11/06/2013   Tdap 01/02/2012   Typhoid Live 02/11/2014   Zoster Recombinat (Shingrix)  10/01/2018, 12/20/2018    TDAP status: Up to date  Flu Vaccine status: Up to date  Pneumococcal vaccine status: Up to date  Covid-19 vaccine status: Information provided on how to obtain vaccines.   Qualifies for Shingles Vaccine? No   Zostavax completed Yes   Shingrix Completed?: Yes  Screening Tests Health Maintenance  Topic Date Due   HEMOGLOBIN A1C  Never done   COVID-19 Vaccine (1) Never done   OPHTHALMOLOGY EXAM  Never done   Hepatitis C Screening  Never done   TETANUS/TDAP  01/01/2022   COLONOSCOPY (Pts 45-11yrs Insurance coverage will need to be confirmed)  11/17/2028   Pneumonia Vaccine 47+ Years old  Completed   INFLUENZA VACCINE  Completed   Zoster Vaccines- Shingrix  Completed   HPV VACCINES  Aged Out   FOOT EXAM  Discontinued    Health Maintenance  Health Maintenance Due  Topic Date Due   HEMOGLOBIN A1C  Never done   COVID-19 Vaccine (1) Never done   OPHTHALMOLOGY EXAM  Never done   Hepatitis C Screening  Never done    Colorectal cancer screening: Type of screening: Colonoscopy. Completed 11/18/2018. Repeat every 10 years  Lung Cancer Screening: (Low Dose CT Chest recommended if Age 12-80 years, 30 pack-year currently smoking OR have quit w/in 15years.) does not qualify.   Lung Cancer Screening Referral: NA  Additional Screening:  Hepatitis C Screening: does qualify- will collect with next lab draw   Vision Screening: Recommended annual ophthalmology exams for early detection of glaucoma and other disorders of the eye. Is the patient up to date with their annual eye exam?  No  Who is the provider or what is the name of the office in which the patient attends annual eye exams? [facility]  If pt is not established with a provider, would they like to be referred to a provider to establish care? No .   Dental Screening: Recommended annual dental exams for proper oral hygiene  Community Resource Referral / Chronic Care Management: CRR required this  visit?  No   CCM required this visit?  No      Plan:     I have personally reviewed and noted the following in the patients chart:   Medical and social history Use of alcohol, tobacco or illicit drugs  Current medications and supplements including opioid prescriptions. Patient is not currently taking opioid prescriptions. Functional ability and status Nutritional status Physical activity Advanced directives List of other physicians Hospitalizations, surgeries, and ER visits in previous 12 months Vitals Screenings to include cognitive, depression, and falls Referrals and appointments  In addition, I have reviewed and discussed with patient certain preventive protocols, quality metrics, and best practice recommendations. A written personalized care plan for preventive services as well as general preventive health recommendations were provided to patient.     Lacretia Nicks, [place]   01/20/2021   Nurse Notes:  Mr. Bohlken , Thank you for taking time to come for your Medicare Wellness Visit. I appreciate your ongoing commitment to your health goals. Please review the following plan we discussed and let me know if I can assist you in the future.   These are the goals we discussed:  Goals   None     This is a list of the screening recommended for you and due dates:  Health Maintenance  Topic Date Due   Hemoglobin A1C  Never done   COVID-19 Vaccine (1) Never done   Eye exam for diabetics  Never done   Hepatitis C Screening: USPSTF Recommendation to screen - Ages 78-79 yo.  Never done   Tetanus Vaccine  01/01/2022   Colon Cancer Screening  11/17/2028   Pneumonia Vaccine  Completed   Flu Shot  Completed   Zoster (Shingles) Vaccine  Completed   HPV Vaccine  Aged Out   Complete foot exam   Discontinued        Time spent min

## 2021-01-21 ENCOUNTER — Other Ambulatory Visit: Payer: Self-pay | Admitting: *Deleted

## 2021-01-21 MED ORDER — JANUMET 50-500 MG PO TABS: 1.0000 | ORAL_TABLET | Freq: Two times a day (BID) | ORAL | 3 refills | Status: AC

## 2021-01-21 NOTE — Progress Notes (Signed)
I have reviewed this visit and agree with the documentation.   

## 2021-02-14 ENCOUNTER — Encounter: Payer: Self-pay | Admitting: Oncology

## 2021-03-15 NOTE — Progress Notes (Incomplete)
03/15/21 4:30 PM   Ricky Jarvis 1950/05/23 [id number]  Referring provider:  Theotis Burrow, MD [address] [place],  [place] [address] No chief complaint on file.    HPI: Ricky Jarvis is a 71 y.o.male with elevated/rising PSA and history of bilateral renal cell carcinoma status post left robotic partial nephrectomy and right nephrectomy returns today for 1 year follow-up with PSA, CT, XRAY, and DRE.   He underwent prostate bx on 12/18/18 which was negative the setting of rising PSA, please see previous notes for details.  His PSA seems to stabilize, most recently 10.2 in 02/05/2020.   Prostate MRI on 08/06/2019 revealed no radiographic evidence of high-grade prostate carcinoma. PI-RADS 2: Low (clinically significant cancer is unlikely to be present).   No significant urinary symptoms.   Personal history of bilateral RCC, status post left robotic partial nephrectomy 08/2016 and more recently right robotic nephrectomy 05/2017, P T3a.  He returns today with serial surveillance imaging.  He did not get his chest x-ray today.       PMH: Past Medical History:  Diagnosis Date   Anemia    Arthritis    Cancer ([place])    Diabetes ([place]) 03/26/2013   GERD (gastroesophageal reflux disease) 03/26/2013   Gout    History of hematuria    HTN (hypertension) 03/26/2013   Renal insufficiency     Surgical History: Past Surgical History:  Procedure Laterality Date   APPENDECTOMY     COLONOSCOPY WITH PROPOFOL N/A 11/18/2018   Procedure: COLONOSCOPY WITH PROPOFOL;  Surgeon: Lin Landsman, MD;  Location: [facility] ENDOSCOPY;  Service: Gastroenterology;  Laterality: N/A;   ROBOTIC ASSITED PARTIAL NEPHRECTOMY Left 08/29/2016   Procedure: ROBOTIC ASSITED PARTIAL NEPHRECTOMY;  Surgeon: Hollice Espy, MD;  Location: [facility] ORS;  Service: Urology;  Laterality: Left;   UPPER GI ENDOSCOPY      Home Medications:  Allergies as of 03/16/2021       Reactions   Pork-derived Products  Anaphylaxis   Azithromycin    Other reaction(s): Other (See Comments)  Pt states that he has hiccups when he takes this medication   Ibuprofen         Medication List        Accurate as of March 15, 2021  4:30 PM. If you have any questions, ask your nurse or doctor.          Allergy Relief 10 MG tablet Generic drug: loratadine Take 10 mg by mouth daily.   amLODipine 5 MG tablet Commonly known as: NORVASC TAKE 1 TABLET BY MOUTH DAILY   benazepril 20 MG tablet Commonly known as: LOTENSIN TAKE 1 TABLET BY MOUTH DAILY AT BEDTIME   cholecalciferol 1000 units tablet Commonly known as: VITAMIN D Take 1,000 Units by mouth daily.   FLUoxetine 10 MG capsule Commonly known as: PROZAC Take 10 mg by mouth daily.   Janumet 50-500 MG tablet Generic drug: sitaGLIPtin-metformin Take 1 tablet by mouth 2 (two) times daily.   lovastatin 20 MG tablet Commonly known as: MEVACOR Take 20 mg by mouth at bedtime.   omeprazole 20 MG capsule Commonly known as: PRILOSEC Take 20 mg by mouth.   rosuvastatin 20 MG tablet Commonly known as: CRESTOR Take 1 tablet by mouth once daily        Allergies:  Allergies  Allergen Reactions   Pork-Derived Products Anaphylaxis   Azithromycin     Other reaction(s): Other (See Comments)  Pt states that he has hiccups when he takes  this medication   Ibuprofen     Family History: Family History  Problem Relation Age of Onset   Kidney cancer Mother    Prostate cancer Neg Hx    Bladder Cancer Neg Hx     Social History:  reports that he has never smoked. He has never used smokeless tobacco. He reports that he does not drink alcohol and does not use drugs.   Physical Exam: There were no vitals taken for this visit.  Constitutional:  Alert and oriented, No acute distress. HEENT: [place] AT, moist mucus membranes.  Trachea midline, no masses. Cardiovascular: No clubbing, cyanosis, or edema. Respiratory: Normal respiratory effort, no  increased work of breathing. Skin: No rashes, bruises or suspicious lesions. Neurologic: Grossly intact, no focal deficits, moving all 4 extremities. Psychiatric: Normal mood and affect.  Laboratory Data:  Lab Results  Component Value Date   CREATININE 1.60 (H) 03/12/2020    Urinalysis   Pertinent Imaging:   Assessment & Plan:     No follow-ups on file.  I,Kailey Littlejohn,acting as a Education administrator for Hollice Espy, MD.,have documented all relevant documentation on the behalf of Hollice Espy, MD,as directed by  Hollice Espy, MD while in the presence of Hollice Espy, MD.' [facility] [address] [place], [place] [address] [phone]

## 2021-03-16 ENCOUNTER — Encounter: Payer: Self-pay | Admitting: Urology

## 2021-03-16 ENCOUNTER — Ambulatory Visit: Payer: Self-pay | Admitting: Urology

## 2021-03-30 ENCOUNTER — Other Ambulatory Visit: Payer: Self-pay | Admitting: Internal Medicine

## 2021-04-11 ENCOUNTER — Other Ambulatory Visit: Payer: Self-pay | Admitting: *Deleted

## 2021-04-11 DIAGNOSIS — D509 Iron deficiency anemia, unspecified: Secondary | ICD-10-CM

## 2021-04-18 ENCOUNTER — Inpatient Hospital Stay: Payer: Medicare Other

## 2021-04-18 ENCOUNTER — Inpatient Hospital Stay: Payer: Medicare Other | Admitting: Oncology

## 2021-04-25 ENCOUNTER — Inpatient Hospital Stay (HOSPITAL_BASED_OUTPATIENT_CLINIC_OR_DEPARTMENT_OTHER): Payer: Medicare Other | Admitting: Oncology

## 2021-04-25 ENCOUNTER — Inpatient Hospital Stay: Payer: Medicare Other | Attending: Oncology

## 2021-04-25 ENCOUNTER — Encounter: Payer: Self-pay | Admitting: Oncology

## 2021-04-25 ENCOUNTER — Other Ambulatory Visit: Payer: Self-pay

## 2021-04-25 VITALS — BP 133/80 | HR 71 | Temp 97.1°F | Resp 16 | Ht 64.0 in | Wt 112.0 lb

## 2021-04-25 DIAGNOSIS — D509 Iron deficiency anemia, unspecified: Secondary | ICD-10-CM | POA: Insufficient documentation

## 2021-04-25 DIAGNOSIS — E538 Deficiency of other specified B group vitamins: Secondary | ICD-10-CM | POA: Diagnosis not present

## 2021-04-25 LAB — VITAMIN B12: Vitamin B-12: 751 pg/mL (ref 180–914)

## 2021-04-25 LAB — CBC WITH DIFFERENTIAL/PLATELET
Abs Immature Granulocytes: 0.02 10*3/uL (ref 0.00–0.07)
Basophils Absolute: 0.1 10*3/uL (ref 0.0–0.1)
Basophils Relative: 1 %
Eosinophils Absolute: 0.4 10*3/uL (ref 0.0–0.5)
Eosinophils Relative: 6 %
HCT: 46.1 % (ref 39.0–52.0)
Hemoglobin: 14.8 g/dL (ref 13.0–17.0)
Immature Granulocytes: 0 %
Lymphocytes Relative: 18 %
Lymphs Abs: 1.4 10*3/uL (ref 0.7–4.0)
MCH: 29.1 pg (ref 26.0–34.0)
MCHC: 32.1 g/dL (ref 30.0–36.0)
MCV: 90.6 fL (ref 80.0–100.0)
Monocytes Absolute: 0.6 10*3/uL (ref 0.1–1.0)
Monocytes Relative: 7 %
Neutro Abs: 5.2 10*3/uL (ref 1.7–7.7)
Neutrophils Relative %: 68 %
Platelets: 292 10*3/uL (ref 150–400)
RBC: 5.09 MIL/uL (ref 4.22–5.81)
RDW: 12.9 % (ref 11.5–15.5)
WBC: 7.6 10*3/uL (ref 4.0–10.5)
nRBC: 0 % (ref 0.0–0.2)

## 2021-04-25 LAB — FERRITIN: Ferritin: 22 ng/mL — ABNORMAL LOW (ref 24–336)

## 2021-04-25 LAB — IRON AND TIBC
Iron: 97 ug/dL (ref 45–182)
Saturation Ratios: 21 % (ref 17.9–39.5)
TIBC: 456 ug/dL — ABNORMAL HIGH (ref 250–450)
UIBC: 359 ug/dL

## 2021-04-25 NOTE — Progress Notes (Signed)
? ? ? ?Hematology/Oncology Consult note ?[facility]  ?[phone] [phone] ? ?Patient Care Team: ?Cletis Athens, MD as PCP - General (Internal Medicine)  ? ?Name of the patient: Ricky Jarvis  ?[id number]  ?July 11, 1950  ? ?Date of visit: 04/25/21 ? ?Diagnosis- normocytic anemia likely a combination of iron and B12 deficiency as well as anemia of chronic kidney disease ?  ? ?Chief complaint/ Reason for visit-routine follow-up of anemia ? ?Heme/Onc history:  patient is a 71 year old gentleman from [place] who speaks Urdu and limited [place].  I was able to converse with him in Hindi which is a language both of [place] can understand comprehend well. His past medical history significant for hypertension hyperlipidemia and chronic kidney disease.  He was referred for anemia.  Most recent CBC from 09/19/2019 showed white count of 7.4, H&H of 11.3/37.6 with an MCV of 77.8 and a platelet count of 352.  Serum creatinine elevated at 1.5.  Last iron studies checked in March 2021 showed a ferritin level of 8 and elevated TIBC.  He has seen Dr. Drucilla Schmidt in the past and has undergone colonoscopy in October 2020 but has not undergone EGD.  Colonoscopy showed 2 small polyps in the rectum and cecum which were negative for malignancy.  No active bleeding was noted at that time.  Patient currently reports ongoing fatigue.  He has lost 4 pounds in the last 1 month.  He continues to be active and works 40 hours a week.  ?  ?  ?Results of blood work from 09/29/2019 were as follows: CBC showed white count of 7.9, H&H of 11.7/37.3 with an MCV of 75.7 and a platelet count of 361.  CMP was significant for elevated creatinine of 1.5.  Folate levels were normal.  B12 level was normal at 109.  Ferritin level was low at 7 with iron studies showing elevated TIBC.  Myeloma panel showed polyclonal increase in immunoglobulins.  Haptoglobin and TSH was normal. ?Patient received IV iron and B12 shots in September  2021 ? ?Interval history-patient presents back to clinic for follow-up for his anemia.  He is currently feeling well.  Denies any bleeding.  Taking B12 supplements. ? ?ECOG PS- 1 ?Pain scale- 0 ? ?Review of systems- Review of Systems  ?Constitutional: Negative.  Negative for chills, fever, malaise/fatigue and weight loss.  ?HENT:  Negative for congestion, ear pain and tinnitus.   ?Eyes: Negative.  Negative for blurred vision and double vision.  ?Respiratory: Negative.  Negative for cough, sputum production and shortness of breath.   ?Cardiovascular: Negative.  Negative for chest pain, palpitations and leg swelling.  ?Gastrointestinal: Negative.  Negative for abdominal pain, constipation, diarrhea, nausea and vomiting.  ?Genitourinary:  Negative for dysuria, frequency and urgency.  ?Musculoskeletal:  Negative for back pain and falls.  ?Skin: Negative.  Negative for rash.  ?Neurological: Negative.  Negative for weakness and headaches.  ?Endo/Heme/Allergies: Negative.  Does not bruise/bleed easily.  ?Psychiatric/Behavioral: Negative.  Negative for depression. The patient is not nervous/anxious and does not have insomnia.    ? ? ? ?Allergies  ?Allergen Reactions  ? Pork-Derived Products Anaphylaxis  ? Azithromycin   ?  Other reaction(s): Other (See Comments) ? Pt states that he has hiccups when he takes this medication  ? Ibuprofen   ? ? ? ?Past Medical History:  ?Diagnosis Date  ? Anemia   ? Arthritis   ? Cancer [facility])   ? Diabetes ([place]) 03/26/2013  ? GERD (gastroesophageal reflux disease) 03/26/2013  ?  Gout   ? History of hematuria   ? HTN (hypertension) 03/26/2013  ? Renal insufficiency   ? ? ? ?Past Surgical History:  ?Procedure Laterality Date  ? APPENDECTOMY    ? COLONOSCOPY WITH PROPOFOL N/A 11/18/2018  ? Procedure: COLONOSCOPY WITH PROPOFOL;  Surgeon: Lin Landsman, MD;  Location[facility] END[facility]COPY;  Service: Gastroenterology;  Laterality: N/A;  ? ROBOTIC ASSITED PARTIAL NEPHRECTOMY Left 08/29/2016  ? Procedure:  ROBOTIC ASSITED PARTIAL NEPHRECTOMY;  Surgeon: Hollice Espy, MD;  Location: [facility] ORS;  Service: Urology;  Laterality: Left;  ? UPPER GI ENDOSCOPY    ? ? ?Social History  ? ?Socioeconomic History  ? Marital status: Married  ?  Spouse name: Not on file  ? Number of children: Not on file  ? Years of education: Not on file  ? Highest education level: Not on file  ?Occupational History  ? Not on file  ?Tobacco Use  ? Smoking status: Never  ? Smokeless tobacco: Never  ?Vaping Use  ? Vaping Use: Never used  ?Substance and Sexual Activity  ? Alcohol use: No  ? Drug use: No  ? Sexual activity: Not Currently  ?Other Topics Concern  ? Not on file  ?Social History Narrative  ? Not on file  ? ?Social Determinants of Health  ? ?Financial Resource Strain: Low Risk   ? Difficulty of Paying Living Expenses: Not hard at all  ?Food Insecurity: No Food Insecurity  ? Worried About Charity fundraiser in the Last Year: Never true  ? Ran Out of Food in the Last Year: Never true  ?Transportation Needs: No Transportation Needs  ? Lack of Transportation (Medical): No  ? Lack of Transportation (Non-Medical): No  ?Physical Activity: Insufficiently Active  ? Days of Exercise per Week: 3 days  ? Minutes of Exercise per Session: 30 min  ?Stress: No Stress Concern Present  ? Feeling of Stress : Not at all  ?Social Connections: Moderately Integrated  ? Frequency of Communication with Friends and Family: More than three times a week  ? Frequency of Social Gatherings with Friends and Family: More than three times a week  ? Attends Religious Services: More than 4 times per year  ? Active Member of Clubs or Organizations: No  ? Attends Archivist Meetings: Never  ? Marital Status: Married  ?Intimate Partner Violence: Not At Risk  ? Fear of Current or Ex-Partner: No  ? Emotionally Abused: No  ? Physically Abused: No  ? Sexually Abused: No  ? ? ?Family History  ?Problem Relation Age of Onset  ? Kidney cancer Mother   ? Prostate cancer Neg Hx    ? Bladder Cancer Neg Hx   ? ? ? ?Current Outpatient Medications:  ?  ALLERGY RELIEF 10 MG tablet, Take 10 mg by mouth daily., Disp: , Rfl:  ?  amLODipine (NORVASC) 5 MG tablet, TAKE 1 TABLET BY MOUTH DAILY, Disp: 90 tablet, Rfl: 3 ?  aspirin EC 81 MG tablet, Take 81 mg by mouth daily. Swallow whole., Disp: , Rfl:  ?  benazepril (LOTENSIN) 20 MG tablet, TAKE 1 TABLET BY MOUTH DAILY AT BEDTIME, Disp: 90 tablet, Rfl: 3 ?  cholecalciferol (VITAMIN D) 1000 units tablet, Take 1,000 Units by mouth daily., Disp: , Rfl:  ?  FLUoxetine (PROZAC) 10 MG capsule, Take 10 mg by mouth daily., Disp: , Rfl:  ?  omeprazole (PRILOSEC) 20 MG capsule, Take 20 mg by mouth. , Disp: , Rfl:  ?  rosuvastatin (CRESTOR)  20 MG tablet, Take 1 tablet by mouth once daily, Disp: 90 tablet, Rfl: 0 ?  sitaGLIPtin-metformin (JANUMET) 50-500 MG tablet, Take 1 tablet by mouth 2 (two) times daily., Disp: 180 tablet, Rfl: 3 ?  vitamin B-12 (CYANOCOBALAMIN) 500 MCG tablet, Take 500 mcg by mouth daily., Disp: , Rfl:  ?  lovastatin (MEVACOR) 20 MG tablet, Take 20 mg by mouth at bedtime. (Patient not taking: Reported on 04/25/2021), Disp: , Rfl:  ? ?Physical exam:  ?Vitals:  ? 04/25/21 1504  ?BP: 133/80  ?Pulse: 71  ?Resp: 16  ?Temp: (!) 97.1 ?F (36.2 ?C)  ?TempSrc: Tympanic  ?SpO2: 99%  ?Weight: 112 lb (50.8 kg)  ?Height: '5\' 4"'$  (1.626 m)  ? ?Physical Exam ?Constitutional:   ?   Appearance: Normal appearance.  ?Cardiovascular:  ?   Rate and Rhythm: Normal rate and regular rhythm.  ?   Heart sounds: Normal heart sounds. No murmur heard. ?Pulmonary:  ?   Effort: Pulmonary effort is normal.  ?   Breath sounds: Normal breath sounds. No wheezing.  ?Abdominal:  ?   General: Bowel sounds are normal. There is no distension.  ?   Palpations: Abdomen is soft.  ?   Tenderness: There is no abdominal tenderness.  ?Musculoskeletal:     ?   General: Normal range of motion.  ?Skin: ?   General: Skin is warm and dry.  ?   Findings: No rash.  ?Neurological:  ?   Mental Status:  He is alert and oriented to person, place, and time.  ?Psychiatric:     ?   Judgment: Judgment normal.  ?  ? ?CMP Latest Ref Rng & Units 03/12/2020  ?Glucose 70 - 99 mg/dL -  ?BUN 8 - 23 mg/dL -  ?Creati

## 2021-04-26 ENCOUNTER — Telehealth: Payer: Self-pay

## 2021-04-26 NOTE — Telephone Encounter (Signed)
Jarrett Soho called and stated that she had this patient on the other line who was returning a call back in regards to him receiving IV IRON. Patient agreed to receive Iv iron.  ?

## 2021-04-27 ENCOUNTER — Telehealth: Payer: Self-pay | Admitting: Oncology

## 2021-04-27 ENCOUNTER — Other Ambulatory Visit: Payer: Self-pay | Admitting: Oncology

## 2021-04-27 NOTE — Telephone Encounter (Signed)
Left VM with patient to confirm iron infusions. Requested he call back.  ?

## 2021-04-27 NOTE — Progress Notes (Signed)
Pt states he will like to have IV iron. Informed pt that scheduler will call back and set up appointments.  ?

## 2021-05-11 ENCOUNTER — Inpatient Hospital Stay: Payer: Medicare Other | Attending: Oncology

## 2021-05-11 ENCOUNTER — Other Ambulatory Visit: Payer: Self-pay | Admitting: *Deleted

## 2021-05-11 VITALS — BP 109/52 | HR 64 | Temp 96.0°F | Resp 18

## 2021-05-11 DIAGNOSIS — D508 Other iron deficiency anemias: Secondary | ICD-10-CM

## 2021-05-11 DIAGNOSIS — D509 Iron deficiency anemia, unspecified: Secondary | ICD-10-CM | POA: Diagnosis present

## 2021-05-11 DIAGNOSIS — C642 Malignant neoplasm of left kidney, except renal pelvis: Secondary | ICD-10-CM

## 2021-05-11 MED ORDER — SODIUM CHLORIDE 0.9 % IV SOLN
Freq: Once | INTRAVENOUS | Status: AC
  Filled 2021-05-11: qty 250

## 2021-05-11 MED ORDER — SODIUM CHLORIDE 0.9 % IV SOLN
510.0000 mg | INTRAVENOUS | Status: DC
  Administered 2021-05-11: 510 mg via INTRAVENOUS
  Filled 2021-05-11: qty 510

## 2021-05-18 ENCOUNTER — Inpatient Hospital Stay: Payer: Medicare Other

## 2021-05-18 VITALS — BP 131/71 | HR 79 | Temp 97.0°F | Resp 18

## 2021-05-18 DIAGNOSIS — D509 Iron deficiency anemia, unspecified: Secondary | ICD-10-CM | POA: Diagnosis not present

## 2021-05-18 DIAGNOSIS — D508 Other iron deficiency anemias: Secondary | ICD-10-CM

## 2021-05-18 MED ORDER — SODIUM CHLORIDE 0.9 % IV SOLN
510.0000 mg | INTRAVENOUS | Status: DC
  Administered 2021-05-18: 510 mg via INTRAVENOUS
  Filled 2021-05-18: qty 510

## 2021-05-18 MED ORDER — SODIUM CHLORIDE 0.9 % IV SOLN
Freq: Once | INTRAVENOUS | Status: AC
  Filled 2021-05-18: qty 250

## 2021-05-25 ENCOUNTER — Ambulatory Visit
Admission: RE | Admit: 2021-05-25 | Discharge: 2021-05-25 | Disposition: A | Discharge status: Home / Self Care | Payer: Medicare Other | Source: Ambulatory Visit | Attending: Urology | Admitting: Urology

## 2021-05-25 DIAGNOSIS — C642 Malignant neoplasm of left kidney, except renal pelvis: Secondary | ICD-10-CM | POA: Insufficient documentation

## 2021-05-25 LAB — POCT I-STAT CREATININE: Creatinine, Ser: 1.5 mg/dL — ABNORMAL HIGH (ref 0.61–1.24)

## 2021-05-25 MED ORDER — IOHEXOL 300 MG/ML  SOLN
80.0000 mL | Freq: Once | INTRAMUSCULAR | Status: AC | PRN
  Administered 2021-05-25: 80 mL via INTRAVENOUS

## 2021-06-07 ENCOUNTER — Other Ambulatory Visit: Payer: Medicare Other

## 2021-06-07 DIAGNOSIS — C642 Malignant neoplasm of left kidney, except renal pelvis: Secondary | ICD-10-CM

## 2021-06-08 LAB — PSA TOTAL (REFLEX TO FREE): Prostate Specific Ag, Serum: 12.4 ng/mL — ABNORMAL HIGH (ref 0.0–4.0)

## 2021-06-08 LAB — TESTOSTERONE: Testosterone: 283 ng/dL (ref 264–916)

## 2021-06-14 ENCOUNTER — Ambulatory Visit: Payer: Medicare Other | Admitting: Urology

## 2021-06-14 ENCOUNTER — Ambulatory Visit
Admission: RE | Admit: 2021-06-14 | Discharge: 2021-06-14 | Disposition: A | Discharge status: Home / Self Care | Payer: Medicare Other | Source: Ambulatory Visit | Attending: Urology | Admitting: Urology

## 2021-06-14 ENCOUNTER — Encounter: Payer: Self-pay | Admitting: Urology

## 2021-06-14 VITALS — BP 100/62 | HR 97 | Ht 64.0 in | Wt 112.0 lb

## 2021-06-14 DIAGNOSIS — C642 Malignant neoplasm of left kidney, except renal pelvis: Secondary | ICD-10-CM

## 2021-06-14 DIAGNOSIS — Z85528 Personal history of other malignant neoplasm of kidney: Secondary | ICD-10-CM | POA: Diagnosis not present

## 2021-06-14 DIAGNOSIS — N183 Chronic kidney disease, stage 3 unspecified: Secondary | ICD-10-CM | POA: Diagnosis not present

## 2021-06-14 DIAGNOSIS — R972 Elevated prostate specific antigen [PSA]: Secondary | ICD-10-CM

## 2021-06-14 NOTE — Progress Notes (Signed)
? ?06/14/21 ?2:35 PM  ? ?Ricky Jarvis ?01/31/1951 ?[id number] ? ?Referring provider:  ?Cletis Athens, MD ?[place] ?[place],  [place] [address] ?Chief Complaint  ?Patient presents with  ? Elevated PSA  ? ? ?HPI: ?Ricky Jarvis is a 71 y.o.male with a personal history of bilateral renal cell carcinoma status post left robotic partial nephrectomy and right nephrectomy, and elevated PSA, who presents today for a 1 year follow-up with CT abdomen and pelvic, chest x-ray, PSA, and DRE.  ? ?He underwent prostate bx on 12/18/18 which was negative the setting of rising PSA, please see previous notes for details.  His PSA seems to stabilize, most recently 12.4 on 06/07/2021. PSA trend below.  ?  ?Prostate MRI on 08/06/2019 revealed no radiographic evidence of high-grade prostate carcinoma. PI-RADS 2: Low (clinically significant cancer is unlikely to be present). ? ?He is s/p left robotic partial nephrectomy 08/2016 and more recently right robotic nephrectomy 05/2017, P T3a.  ? ?He returns today with serial imaging. CT abdomen and pelvis on 05/25/2021 visualized a subcentimeter upper pole left renal hypoattenuating lesion centrally which is too small to characterize but most likely a cyst. Slightly ?more distinct today. No suspicious left renal mass. Right nephrectomy, without local recurrence. ? ?He reports that he has some urinary dribbling.   He otherwise does not have any significant urinary symptoms. ? ?Baseline creatinine approximate 1.3. ? ? ? ?PSA trend:  ? Prostate Specific Ag, Serum  ?Latest Ref Rng 0.0 - 4.0 ng/mL  ?06/30/2019 11.3 (H)   ?08/22/2019 10.6 (H)   ?02/05/2020 10.2 (H)   ?06/07/2021 12.4 (H)   ?  ?(H) High ? ? ?PMH: ?Past Medical History:  ?Diagnosis Date  ? Anemia   ? Arthritis   ? Cancer [facility])   ? Diabetes ([place]) 03/26/2013  ? GERD (gastroesophageal reflux disease) 03/26/2013  ? Gout   ? History of hematuria   ? HTN (hypertension) 03/26/2013  ? Renal insufficiency   ? ? ?Surgical History: ?Past Surgical History:   ?Procedure Laterality Date  ? APPENDECTOMY    ? COLONOSCOPY WITH PROPOFOL N/A 11/18/2018  ? Procedure: COLONOSCOPY WITH PROPOFOL;  Surgeon: Lin Landsman, MD;  Location: [facility] ENDOSCOPY;  Service: Gastroenterology;  Laterality: N/A;  ? ROBOTIC ASSITED PARTIAL NEPHRECTOMY Left 08/29/2016  ? Procedure: ROBOTIC ASSITED PARTIAL NEPHRECTOMY;  Surgeon: Hollice Espy, MD;  Location: [facility] ORS;  Service: Urology;  Laterality: Left;  ? UPPER GI ENDOSCOPY    ? ? ?Home Medications:  ?Allergies as of 06/14/2021   ? ?   Reactions  ? Pork-derived Products Anaphylaxis  ? Azithromycin   ? Other reaction(s): Other (See Comments) ? Pt states that he has hiccups when he takes this medication  ? Ibuprofen   ? ?  ? ?  ?Medication List  ?  ? ?  ? Accurate as of Jun 14, 2021  2:35 PM. If you have any questions, ask your nurse or doctor.  ?  ?  ? ?  ? ?Allergy Relief 10 MG tablet ?Generic drug: loratadine ?Take 10 mg by mouth daily. ?  ?amLODipine 5 MG tablet ?Commonly known as: NORVASC ?TAKE 1 TABLET BY MOUTH DAILY ?  ?aspirin EC 81 MG tablet ?Take 81 mg by mouth daily. Swallow whole. ?  ?benazepril 20 MG tablet ?Commonly known as: LOTENSIN ?TAKE 1 TABLET BY MOUTH DAILY AT BEDTIME ?  ?cholecalciferol 1000 units tablet ?Commonly known as: VITAMIN D ?Take 1,000 Units by mouth daily. ?  ?FLUoxetine 10 MG capsule ?Commonly known  as: PROZAC ?Take 10 mg by mouth daily. ?  ?Janumet 50-500 MG tablet ?Generic drug: sitaGLIPtin-metformin ?Take 1 tablet by mouth 2 (two) times daily. ?  ?lovastatin 20 MG tablet ?Commonly known as: MEVACOR ?Take 20 mg by mouth at bedtime. ?  ?omeprazole 20 MG capsule ?Commonly known as: PRILOSEC ?Take 20 mg by mouth. ?  ?rosuvastatin 20 MG tablet ?Commonly known as: CRESTOR ?Take 1 tablet by mouth once daily ?  ?vitamin B-12 500 MCG tablet ?Commonly known as: CYANOCOBALAMIN ?Take 500 mcg by mouth daily. ?  ? ?  ? ? ?Allergies:  ?Allergies  ?Allergen Reactions  ? Pork-Derived Products Anaphylaxis  ? Azithromycin   ?   Other reaction(s): Other (See Comments) ? Pt states that he has hiccups when he takes this medication  ? Ibuprofen   ? ? ?Family History: ?Family History  ?Problem Relation Age of Onset  ? Kidney cancer Mother   ? Prostate cancer Neg Hx   ? Bladder Cancer Neg Hx   ? ? ?Social History:  reports that he has never smoked. He has never used smokeless tobacco. He reports that he does not drink alcohol and does not use drugs. ? ? ?Physical Exam: ?BP 100/62   Pulse 97   Ht '5\' 4"'$  (1.626 m)   Wt 112 lb (50.8 kg)   BMI 19.22 kg/m?   ?Constitutional:  Alert and oriented, No acute distress. ?HEENT: [place] AT, moist mucus membranes.  Trachea midline, no masses. ?Cardiovascular: No clubbing, cyanosis, or edema. ?Respiratory: Normal respiratory effort, no increased work of breathing. ?Rectal: Normal sphincter tone,  enlarged prostate, smooth with rubbery nodularity, diffuse ?Skin: No rashes, bruises or suspicious lesions. ?Neurologic: Grossly intact, no focal deficits, moving all 4 extremities. ?Psychiatric: Normal mood and affect. ? ?Laboratory Data: ?Lab Results  ?Component Value Date  ? CREATININE 1.50 (H) 05/25/2021  ? ? ? ?Pertinent imaging:  ?CT ABDOMEN AND PELVIS WITH CONTRAST ?  ?TECHNIQUE: ?Multidetector CT imaging of the abdomen and pelvis was performed ?using the standard protocol following bolus administration of ?intravenous contrast. ?  ?RADIATION DOSE REDUCTION: This exam was performed according to the ?departmental dose-optimization program which includes automated ?exposure control, adjustment of the mA and/or kV according to ?patient size and/or use of iterative reconstruction technique. ?  ?CONTRAST:  81m OMNIPAQUE IOHEXOL 300 MG/ML  SOLN ?  ?COMPARISON:  03/12/2020 ?  ?FINDINGS: ?Lower chest: Clear lung bases. Normal heart size without pericardial ?or pleural effusion. Three vessel coronary artery calcification. ?  ?Hepatobiliary: Normal liver. Tiny gallstone without acute ?cholecystitis or biliary duct  dilatation. ?  ?Pancreas: Normal, without mass or ductal dilatation. ?  ?Spleen: Normal in size, without focal abnormality. ?  ?Adrenals/Urinary Tract: Normal adrenal glands. Suspect a ?subcentimeter upper pole left renal hypoattenuating lesion centrally ?which is too small to characterize but most likely a cyst. Slightly ?more distinct today. No suspicious left renal mass. Right ?nephrectomy, without local recurrence. ?  ?Stomach/Bowel: Normal stomach, without wall thickening. Normal colon ?and terminal ileum. Normal small bowel. ?  ?Vascular/Lymphatic: Advanced aortic and branch vessel ?atherosclerosis. No abdominopelvic adenopathy. ?  ?Reproductive: Mild prostatomegaly. ?  ?Other: No significant free fluid. ?  ?Musculoskeletal: No acute osseous abnormality. ?  ?IMPRESSION: ?1. Status post right nephrectomy, without recurrent or metastatic ?disease. ?2. Cholelithiasis ?3. Coronary artery atherosclerosis. Aortic Atherosclerosis ?(ICD10-I70.0). ?  ?  ?Electronically Signed ?  By: KAbigail MiyamotoM.D. ?  On: 05/26/2021 12:56 ? ? ?I have personally reviewed the images and agree with radiologist  interpretation.  ? ? ? ?Assessment & Plan:   ?Clear cell renal cell carcinoma, left and right  ?- NED  ?-We will continue surveillance for total of 5 years given PT3 disease, final study next year ?- He is due for surveillnce chest xray as part of suriveillance imaging; scheduled  ? ?2. Elevated PSA ?- Essentially stable reassuring work-up in 2021 including MRI when prostate was about the same size  ?-Rectal exam today is stable ?-Minimally symptomatic ? ?3. CKD, stage III ?-Renal function essentially stable, solitary kidney precautions ? ?Follow-up in 1 year with CT abdomen pelvis with contrast, chest x-ray, PSA/DRE ? ?I,Kailey Littlejohn,acting as a Education administrator for Hollice Espy, MD.,have documented all relevant documentation on the behalf of Hollice Espy, MD,as directed by  Hollice Espy, MD while in the presence of Hollice Espy, MD. ? ?I have reviewed the above documentation for accuracy and completeness, and I agree with the above.  ? ?Hollice Espy, MD ? ? ?[place] ?[address], [address] ?[place]

## 2021-07-05 ENCOUNTER — Other Ambulatory Visit: Payer: Self-pay | Admitting: Internal Medicine

## 2021-07-22 ENCOUNTER — Other Ambulatory Visit: Payer: Self-pay | Admitting: *Deleted

## 2021-07-22 MED ORDER — AMLODIPINE BESYLATE 5 MG PO TABS: 5.0000 mg | ORAL_TABLET | Freq: Every day | ORAL | 3 refills | Status: AC

## 2021-08-25 ENCOUNTER — Inpatient Hospital Stay: Payer: Medicare Other | Attending: Oncology

## 2021-08-25 DIAGNOSIS — D509 Iron deficiency anemia, unspecified: Secondary | ICD-10-CM | POA: Diagnosis present

## 2021-08-25 DIAGNOSIS — E538 Deficiency of other specified B group vitamins: Secondary | ICD-10-CM

## 2021-08-25 LAB — CBC WITH DIFFERENTIAL/PLATELET
Abs Immature Granulocytes: 0.01 10*3/uL (ref 0.00–0.07)
Basophils Absolute: 0.1 10*3/uL (ref 0.0–0.1)
Basophils Relative: 1 %
Eosinophils Absolute: 0.8 10*3/uL — ABNORMAL HIGH (ref 0.0–0.5)
Eosinophils Relative: 11 %
HCT: 42.5 % (ref 39.0–52.0)
Hemoglobin: 14.3 g/dL (ref 13.0–17.0)
Immature Granulocytes: 0 %
Lymphocytes Relative: 22 %
Lymphs Abs: 1.7 10*3/uL (ref 0.7–4.0)
MCH: 30.9 pg (ref 26.0–34.0)
MCHC: 33.6 g/dL (ref 30.0–36.0)
MCV: 91.8 fL (ref 80.0–100.0)
Monocytes Absolute: 0.6 10*3/uL (ref 0.1–1.0)
Monocytes Relative: 7 %
Neutro Abs: 4.5 10*3/uL (ref 1.7–7.7)
Neutrophils Relative %: 59 %
Platelets: 261 10*3/uL (ref 150–400)
RBC: 4.63 MIL/uL (ref 4.22–5.81)
RDW: 12.9 % (ref 11.5–15.5)
WBC: 7.7 10*3/uL (ref 4.0–10.5)
nRBC: 0 % (ref 0.0–0.2)

## 2021-08-25 LAB — IRON AND TIBC
Iron: 84 ug/dL (ref 45–182)
Saturation Ratios: 23 % (ref 17.9–39.5)
TIBC: 363 ug/dL (ref 250–450)
UIBC: 279 ug/dL

## 2021-08-25 LAB — VITAMIN B12: Vitamin B-12: 298 pg/mL (ref 180–914)

## 2021-08-25 LAB — FERRITIN: Ferritin: 246 ng/mL (ref 24–336)

## 2021-09-27 ENCOUNTER — Other Ambulatory Visit: Payer: Self-pay | Admitting: Internal Medicine

## 2021-10-04 ENCOUNTER — Telehealth: Payer: Self-pay

## 2021-10-04 DIAGNOSIS — E1129 Type 2 diabetes mellitus with other diabetic kidney complication: Secondary | ICD-10-CM

## 2021-10-04 NOTE — Telephone Encounter (Signed)
Pharmacy called and asked for test strips sent to [facility]

## 2021-12-23 ENCOUNTER — Other Ambulatory Visit: Payer: Self-pay

## 2021-12-23 DIAGNOSIS — D509 Iron deficiency anemia, unspecified: Secondary | ICD-10-CM

## 2021-12-23 DIAGNOSIS — E538 Deficiency of other specified B group vitamins: Secondary | ICD-10-CM

## 2021-12-24 ENCOUNTER — Other Ambulatory Visit: Payer: Self-pay | Admitting: Internal Medicine

## 2021-12-26 ENCOUNTER — Inpatient Hospital Stay (HOSPITAL_BASED_OUTPATIENT_CLINIC_OR_DEPARTMENT_OTHER): Payer: Medicare Other | Admitting: Oncology

## 2021-12-26 ENCOUNTER — Encounter: Payer: Self-pay | Admitting: Oncology

## 2021-12-26 ENCOUNTER — Inpatient Hospital Stay: Payer: Medicare Other | Attending: Oncology

## 2021-12-26 VITALS — BP 124/75 | HR 80 | Temp 97.0°F | Resp 14 | Wt 113.6 lb

## 2021-12-26 DIAGNOSIS — E538 Deficiency of other specified B group vitamins: Secondary | ICD-10-CM | POA: Diagnosis not present

## 2021-12-26 DIAGNOSIS — D509 Iron deficiency anemia, unspecified: Secondary | ICD-10-CM | POA: Insufficient documentation

## 2021-12-26 DIAGNOSIS — D649 Anemia, unspecified: Secondary | ICD-10-CM | POA: Diagnosis present

## 2021-12-26 LAB — CBC WITH DIFFERENTIAL/PLATELET
Abs Immature Granulocytes: 0.03 10*3/uL (ref 0.00–0.07)
Basophils Absolute: 0.1 10*3/uL (ref 0.0–0.1)
Basophils Relative: 1 %
Eosinophils Absolute: 0.6 10*3/uL — ABNORMAL HIGH (ref 0.0–0.5)
Eosinophils Relative: 7 %
HCT: 45.4 % (ref 39.0–52.0)
Hemoglobin: 15 g/dL (ref 13.0–17.0)
Immature Granulocytes: 0 %
Lymphocytes Relative: 18 %
Lymphs Abs: 1.5 10*3/uL (ref 0.7–4.0)
MCH: 29.8 pg (ref 26.0–34.0)
MCHC: 33 g/dL (ref 30.0–36.0)
MCV: 90.3 fL (ref 80.0–100.0)
Monocytes Absolute: 0.7 10*3/uL (ref 0.1–1.0)
Monocytes Relative: 8 %
Neutro Abs: 5.5 10*3/uL (ref 1.7–7.7)
Neutrophils Relative %: 66 %
Platelets: 286 10*3/uL (ref 150–400)
RBC: 5.03 MIL/uL (ref 4.22–5.81)
RDW: 12.3 % (ref 11.5–15.5)
WBC: 8.3 10*3/uL (ref 4.0–10.5)
nRBC: 0 % (ref 0.0–0.2)

## 2021-12-26 LAB — IRON AND TIBC
Iron: 90 ug/dL (ref 45–182)
Saturation Ratios: 24 % (ref 17.9–39.5)
TIBC: 370 ug/dL (ref 250–450)
UIBC: 280 ug/dL

## 2021-12-26 LAB — FERRITIN: Ferritin: 198 ng/mL (ref 24–336)

## 2021-12-26 LAB — VITAMIN B12: Vitamin B-12: 230 pg/mL (ref 180–914)

## 2021-12-26 NOTE — Progress Notes (Signed)
Hematology/Oncology Consult note [facility]  Telephone:([phone] Fax:([phone]  Patient Care Team: Cletis Athens, MD as PCP - General (Internal Medicine)   Name of the patient: Ricky Jarvis  [id number]  24-Jul-1950   Date of visit: 12/26/21  Diagnosis-normocytic anemia likely secondary to iron deficiency and B12 deficiency  Chief complaint/ Reason for visit-routine follow-up of anemia  Heme/Onc history:  patient is a 71 year old gentleman from [place] who speaks Urdu and limited [place].  I was able to converse with him in Hindi which is a language both of [place] can understand comprehend well. His past medical history significant for hypertension hyperlipidemia and chronic kidney disease.  He was referred for anemia.  Most recent CBC from 09/19/2019 showed white count of 7.4, H&H of 11.3/37.6 with an MCV of 77.8 and a platelet count of 352.  Serum creatinine elevated at 1.5.  Last iron studies checked in March 2021 showed a ferritin level of 8 and elevated TIBC.  He has seen Dr. Drucilla Schmidt in the past and has undergone colonoscopy in October 2020 but has not undergone EGD.  Colonoscopy showed 2 small polyps in the rectum and cecum which were negative for malignancy.  No active bleeding was noted at that time.  Patient currently reports ongoing fatigue.  He has lost 4 pounds in the last 1 month.  He continues to be active and works 40 hours a week.      Results of blood work from 09/29/2019 were as follows: CBC showed white count of 7.9, H&H of 11.7/37.3 with an MCV of 75.7 and a platelet count of 361.  CMP was significant for elevated creatinine of 1.5.  Folate levels were normal.  B12 level was normal at 109.  Ferritin level was low at 7 with iron studies showing elevated TIBC.  Myeloma panel showed polyclonal increase in immunoglobulins.  Haptoglobin and TSH was normal. Patient received IV iron and B12 shots in September 2021  Interval history-patient is doing  well for his age.  He continues to work 7 days a week for 6 hours a day.  Reports that his energy levels are doing good.  He is taking oral B12.  ECOG PS- 1 Pain scale- 0   Review of systems- Review of Systems  Constitutional:  Negative for chills, fever, malaise/fatigue and weight loss.  HENT:  Negative for congestion, ear discharge and nosebleeds.   Eyes:  Negative for blurred vision.  Respiratory:  Negative for cough, hemoptysis, sputum production, shortness of breath and wheezing.   Cardiovascular:  Negative for chest pain, palpitations, orthopnea and claudication.  Gastrointestinal:  Negative for abdominal pain, blood in stool, constipation, diarrhea, heartburn, melena, nausea and vomiting.  Genitourinary:  Negative for dysuria, flank pain, frequency, hematuria and urgency.  Musculoskeletal:  Negative for back pain, joint pain and myalgias.  Skin:  Negative for rash.  Neurological:  Negative for dizziness, tingling, focal weakness, seizures, weakness and headaches.  Endo/Heme/Allergies:  Does not bruise/bleed easily.  Psychiatric/Behavioral:  Negative for depression and suicidal ideas. The patient does not have insomnia.       Allergies  Allergen Reactions   Pork-Derived Products Anaphylaxis   Azithromycin     Other reaction(s): Other (See Comments)  Pt states that he has hiccups when he takes this medication   Ibuprofen      Past Medical History:  Diagnosis Date   Anemia    Arthritis    Cancer ([place])    Diabetes ([place]) 03/26/2013  GERD (gastroesophageal reflux disease) 03/26/2013   Gout    History of hematuria    HTN (hypertension) 03/26/2013   Renal insufficiency      Past Surgical History:  Procedure Laterality Date   APPENDECTOMY     COLONOSCOPY WITH PROPOFOL N/A 11/18/2018   Procedure: COLONOSCOPY WITH PROPOFOL;  Surgeon: Lin Landsman, MD;  Location: [facility] ENDOSCOPY;  Service: Gastroenterology;  Laterality: N/A;   ROBOTIC ASSITED PARTIAL NEPHRECTOMY Left  08/29/2016   Procedure: ROBOTIC ASSITED PARTIAL NEPHRECTOMY;  Surgeon: Hollice Espy, MD;  Location: [facility] ORS;  Service: Urology;  Laterality: Left;   UPPER GI ENDOSCOPY      Social History   Socioeconomic History   Marital status: Married    Spouse name: Not on file   Number of children: Not on file   Years of education: Not on file   Highest education level: Not on file  Occupational History   Not on file  Tobacco Use   Smoking status: Never   Smokeless tobacco: Never  Vaping Use   Vaping Use: Never used  Substance and Sexual Activity   Alcohol use: No   Drug use: No   Sexual activity: Not Currently  Other Topics Concern   Not on file  Social History Narrative   Not on file   Social Determinants of Health   Financial Resource Strain: Low Risk  (01/20/2021)   Overall Financial Resource Strain (CARDIA)    Difficulty of Paying Living Expenses: Not hard at all  Food Insecurity: No Food Insecurity (01/20/2021)   Hunger Vital Sign    Worried About Running Out of Food in the Last Year: Never true    Ran Out of Food in the Last Year: Never true  Transportation Needs: No Transportation Needs (01/20/2021)   PRAPARE - Hydrologist (Medical): No    Lack of Transportation (Non-Medical): No  Physical Activity: Insufficiently Active (01/20/2021)   Exercise Vital Sign    Days of Exercise per Week: 3 days    Minutes of Exercise per Session: 30 min  Stress: No Stress Concern Present (01/20/2021)   [place]    Feeling of Stress : Not at all  Social Connections: Moderately Integrated (01/20/2021)   Social Connection and Isolation Panel [NHANES]    Frequency of Communication with Friends and Family: More than three times a week    Frequency of Social Gatherings with Friends and Family: More than three times a week    Attends Religious Services: More than 4 times per year    Active  Member of [employer] or Organizations: No    Attends Archivist Meetings: Never    Marital Status: Married  Human resources officer Violence: Not At Risk (01/20/2021)   Humiliation, Afraid, Rape, and Kick questionnaire    Fear of Current or Ex-Partner: No    Emotionally Abused: No    Physically Abused: No    Sexually Abused: No    Family History  Problem Relation Age of Onset   Kidney cancer Mother    Prostate cancer Neg Hx    Bladder Cancer Neg Hx      Current Outpatient Medications:    amLODipine (NORVASC) 5 MG tablet, Take 1 tablet (5 mg total) by mouth daily., Disp: 90 tablet, Rfl: 3   aspirin EC 81 MG tablet, Take 81 mg by mouth daily. Swallow whole., Disp: , Rfl:    benazepril (LOTENSIN) 20 MG tablet, TAKE 1  TABLET BY MOUTH DAILY AT BEDTIME, Disp: 90 tablet, Rfl: 3   cholecalciferol (VITAMIN D) 1000 units tablet, Take 1,000 Units by mouth daily., Disp: , Rfl:    FLUoxetine (PROZAC) 10 MG capsule, Take 10 mg by mouth daily., Disp: , Rfl:    lovastatin (MEVACOR) 20 MG tablet, Take 20 mg by mouth at bedtime., Disp: , Rfl:    rosuvastatin (CRESTOR) 20 MG tablet, Take 1 tablet by mouth once daily, Disp: 90 tablet, Rfl: 0   sitaGLIPtin-metformin (JANUMET) 50-500 MG tablet, Take 1 tablet by mouth 2 (two) times daily., Disp: 180 tablet, Rfl: 3   ALLERGY RELIEF 10 MG tablet, Take 10 mg by mouth daily., Disp: , Rfl:    omeprazole (PRILOSEC) 20 MG capsule, Take 20 mg by mouth.  (Patient not taking: Reported on 12/26/2021), Disp: , Rfl:    vitamin B-12 (CYANOCOBALAMIN) 500 MCG tablet, Take 500 mcg by mouth daily. (Patient not taking: Reported on 12/26/2021), Disp: , Rfl:   Physical exam:  Vitals:   12/26/21 1416  BP: 124/75  Pulse: 80  Resp: 14  Temp: (!) 97 F (36.1 C)  SpO2: 98%  Weight: 113 lb 9.6 oz (51.5 kg)   Physical Exam Cardiovascular:     Rate and Rhythm: Normal rate and regular rhythm.     Heart sounds: Normal heart sounds.  Pulmonary:     Effort: Pulmonary effort  is normal.     Breath sounds: Normal breath sounds.  Abdominal:     General: Bowel sounds are normal.     Palpations: Abdomen is soft.  Skin:    General: Skin is warm and dry.  Neurological:     Mental Status: He is alert and oriented to person, place, and time.         Latest Ref Rng & Units 05/25/2021    2:59 PM  CMP  Creatinine 0.61 - 1.24 mg/dL 1.50       Latest Ref Rng & Units 12/26/2021    1:56 PM  CBC  WBC 4.0 - 10.5 K/uL 8.3   Hemoglobin 13.0 - 17.0 g/dL 15.0   Hematocrit 39.0 - 52.0 % 45.4   Platelets 150 - 400 K/uL 286      Assessment and plan- Patient is a 71 y.o. male here for routine follow-up of iron and B12 deficiency  anemia  Patient is not presently anemic with a hemoglobin of 15.  Iron studies are within normal limits.  B12 levels are pending.  We will repeat CBC ferritin and iron studies and B12 levels in 4 and 8 months and I will see him back in 8 months   Visit Diagnosis 1. Iron deficiency anemia, unspecified iron deficiency anemia type   2. B12 deficiency      Dr. Randa Evens, MD, MPH [facility] at [facility] [phone] 12/26/2021 4:37 PM

## 2021-12-28 NOTE — Progress Notes (Signed)
Tried to reach pt regarding low b12 levels to ask if he is taking oral b12 or if he will like to switch over to monthly injections. Pt did not answer left a VM providing details and a return call back phone number.

## 2022-01-04 ENCOUNTER — Other Ambulatory Visit: Payer: Self-pay | Admitting: Internal Medicine

## 2022-01-05 ENCOUNTER — Other Ambulatory Visit: Payer: Self-pay | Admitting: Internal Medicine

## 2022-01-10 ENCOUNTER — Ambulatory Visit: Payer: Medicare Other | Admitting: Internal Medicine

## 2022-01-11 ENCOUNTER — Encounter: Payer: Medicare Other | Admitting: Internal Medicine

## 2022-01-11 DIAGNOSIS — E785 Hyperlipidemia, unspecified: Secondary | ICD-10-CM

## 2022-01-11 MED ORDER — ROSUVASTATIN CALCIUM 20 MG PO TABS: 20.0000 mg | ORAL_TABLET | Freq: Every day | ORAL | 0 refills | Status: AC

## 2022-02-07 NOTE — Progress Notes (Signed)
This encounter was created in error - please disregard.  This encounter was created in error - please disregard.

## 2022-02-21 ENCOUNTER — Other Ambulatory Visit: Payer: Self-pay | Admitting: Internal Medicine

## 2022-04-19 ENCOUNTER — Encounter: Payer: Self-pay | Admitting: Oncology

## 2022-04-26 ENCOUNTER — Inpatient Hospital Stay: Payer: Medicare Other | Attending: Oncology

## 2022-06-12 ENCOUNTER — Encounter: Payer: Self-pay | Admitting: Oncology

## 2022-06-20 ENCOUNTER — Ambulatory Visit: Payer: Medicare Other | Admitting: Urology

## 2022-07-14 ENCOUNTER — Other Ambulatory Visit: Payer: Self-pay

## 2022-07-14 DIAGNOSIS — C642 Malignant neoplasm of left kidney, except renal pelvis: Secondary | ICD-10-CM

## 2022-07-17 ENCOUNTER — Other Ambulatory Visit: Payer: Self-pay

## 2022-07-17 DIAGNOSIS — R972 Elevated prostate specific antigen [PSA]: Secondary | ICD-10-CM

## 2022-07-18 ENCOUNTER — Other Ambulatory Visit: Payer: Medicare Other

## 2022-07-18 ENCOUNTER — Ambulatory Visit: Payer: Medicare Other | Admitting: Urology

## 2022-07-18 DIAGNOSIS — R972 Elevated prostate specific antigen [PSA]: Secondary | ICD-10-CM

## 2022-07-19 LAB — PSA: Prostate Specific Ag, Serum: 10.5 ng/mL — ABNORMAL HIGH (ref 0.0–4.0)

## 2022-07-25 ENCOUNTER — Ambulatory Visit
Admission: RE | Admit: 2022-07-25 | Discharge: 2022-07-25 | Disposition: A | Discharge status: Home / Self Care | Payer: Medicare Other | Source: Ambulatory Visit | Attending: Urology | Admitting: Urology

## 2022-07-25 DIAGNOSIS — C642 Malignant neoplasm of left kidney, except renal pelvis: Secondary | ICD-10-CM | POA: Insufficient documentation

## 2022-07-25 LAB — POCT I-STAT CREATININE: Creatinine, Ser: 1.4 mg/dL — ABNORMAL HIGH (ref 0.61–1.24)

## 2022-07-25 MED ORDER — IOHEXOL 300 MG/ML  SOLN
80.0000 mL | Freq: Once | INTRAMUSCULAR | Status: AC | PRN
  Administered 2022-07-25: 80 mL via INTRAVENOUS

## 2022-08-16 ENCOUNTER — Ambulatory Visit
Admission: RE | Admit: 2022-08-16 | Discharge: 2022-08-16 | Disposition: A | Discharge status: Home / Self Care | Payer: Medicare Other | Source: Ambulatory Visit | Attending: Urology | Admitting: Urology

## 2022-08-16 ENCOUNTER — Telehealth: Payer: Medicare Other | Admitting: Urology

## 2022-08-16 DIAGNOSIS — C642 Malignant neoplasm of left kidney, except renal pelvis: Secondary | ICD-10-CM | POA: Insufficient documentation

## 2022-08-28 ENCOUNTER — Inpatient Hospital Stay: Payer: Medicare Other | Admitting: Oncology

## 2022-08-28 ENCOUNTER — Inpatient Hospital Stay: Payer: Medicare Other

## 2022-09-05 ENCOUNTER — Inpatient Hospital Stay: Payer: Medicare Other

## 2022-09-05 ENCOUNTER — Inpatient Hospital Stay: Payer: Medicare Other | Attending: Oncology | Admitting: Oncology

## 2022-09-05 ENCOUNTER — Encounter: Payer: Self-pay | Admitting: Oncology

## 2022-09-05 VITALS — BP 107/55 | HR 73 | Temp 97.1°F | Resp 18 | Ht 64.0 in | Wt 115.4 lb

## 2022-09-05 DIAGNOSIS — E538 Deficiency of other specified B group vitamins: Secondary | ICD-10-CM

## 2022-09-05 DIAGNOSIS — D519 Vitamin B12 deficiency anemia, unspecified: Secondary | ICD-10-CM | POA: Insufficient documentation

## 2022-09-05 DIAGNOSIS — D508 Other iron deficiency anemias: Secondary | ICD-10-CM

## 2022-09-05 DIAGNOSIS — Z8051 Family history of malignant neoplasm of kidney: Secondary | ICD-10-CM | POA: Insufficient documentation

## 2022-09-05 DIAGNOSIS — D509 Iron deficiency anemia, unspecified: Secondary | ICD-10-CM | POA: Diagnosis not present

## 2022-09-05 LAB — CBC WITH DIFFERENTIAL/PLATELET
Abs Immature Granulocytes: 0.02 10*3/uL (ref 0.00–0.07)
Basophils Absolute: 0 10*3/uL (ref 0.0–0.1)
Basophils Relative: 1 %
Eosinophils Absolute: 0.3 10*3/uL (ref 0.0–0.5)
Eosinophils Relative: 4 %
HCT: 42.6 % (ref 39.0–52.0)
Hemoglobin: 14 g/dL (ref 13.0–17.0)
Immature Granulocytes: 0 %
Lymphocytes Relative: 25 %
Lymphs Abs: 2.1 10*3/uL (ref 0.7–4.0)
MCH: 29.3 pg (ref 26.0–34.0)
MCHC: 32.9 g/dL (ref 30.0–36.0)
MCV: 89.1 fL (ref 80.0–100.0)
Monocytes Absolute: 0.6 10*3/uL (ref 0.1–1.0)
Monocytes Relative: 7 %
Neutro Abs: 5.3 10*3/uL (ref 1.7–7.7)
Neutrophils Relative %: 63 %
Platelets: 278 10*3/uL (ref 150–400)
RBC: 4.78 MIL/uL (ref 4.22–5.81)
RDW: 12.3 % (ref 11.5–15.5)
WBC: 8.3 10*3/uL (ref 4.0–10.5)
nRBC: 0 % (ref 0.0–0.2)

## 2022-09-05 LAB — IRON AND TIBC
Iron: 86 ug/dL (ref 45–182)
Saturation Ratios: 23 % (ref 17.9–39.5)
TIBC: 381 ug/dL (ref 250–450)
UIBC: 295 ug/dL

## 2022-09-05 LAB — VITAMIN B12: Vitamin B-12: 193 pg/mL (ref 180–914)

## 2022-09-05 LAB — FERRITIN: Ferritin: 139 ng/mL (ref 24–336)

## 2022-09-10 ENCOUNTER — Encounter: Payer: Self-pay | Admitting: Oncology

## 2022-09-10 NOTE — Progress Notes (Signed)
Hematology/Oncology Consult note [facility]  Telephone:([phone] Fax:([phone]  Patient Care Team: Corky Downs, MD as PCP - General (Internal Medicine) Creig Hines, MD as Consulting Physician (Oncology)   Name of the patient: Ricky Jarvis  [id number]  [birth year]   Date of visit: 09/10/22  Diagnosis-iron and B12 deficiency anemia  Chief complaint/ Reason for visit-routine follow-up of anemia  Heme/Onc history: patient is a 72 year old gentleman from [place] who speaks Urdu and limited [place].  I was able to converse with him in Hindi which is a language both of [place] can understand comprehend well. His past medical history significant for hypertension hyperlipidemia and chronic kidney disease.  He was referred for anemia.  Most recent CBC from 09/19/2019 showed white count of 7.4, H&H of 11.3/37.6 with an MCV of 77.8 and a platelet count of 352.  Serum creatinine elevated at 1.5.  Last iron studies checked in March 2021 showed a ferritin level of 8 and elevated TIBC.  He has seen Dr. Sigurd Sos in the past and has undergone colonoscopy in October 2020 but has not undergone EGD.  Colonoscopy showed 2 small polyps in the rectum and cecum which were negative for malignancy.  No active bleeding was noted at that time.  Patient currently reports ongoing fatigue.  He has lost 4 pounds in the last 1 month.  He continues to be active and works 40 hours a week.      Results of blood work from 09/29/2019 were as follows: CBC showed white count of 7.9, H&H of 11.7/37.3 with an MCV of 75.7 and a platelet count of 361.  CMP was significant for elevated creatinine of 1.5.  Folate levels were normal.  B12 level was normal at 109.  Ferritin level was low at 7 with iron studies showing elevated TIBC.  Myeloma panel showed polyclonal increase in immunoglobulins.  Haptoglobin and TSH was normal. Patient received IV iron and B12 shots in September 2021  Interval  history-patient feels well overall.  He continues to work part-time.  Denies any blood loss in the stool or urine.  He has not been taking any oral B12 tablets presently.  ECOG PS- 0 Pain scale- 0   Review of systems- Review of Systems  Constitutional:  Negative for chills, fever, malaise/fatigue and weight loss.  HENT:  Negative for congestion, ear discharge and nosebleeds.   Eyes:  Negative for blurred vision.  Respiratory:  Negative for cough, hemoptysis, sputum production, shortness of breath and wheezing.   Cardiovascular:  Negative for chest pain, palpitations, orthopnea and claudication.  Gastrointestinal:  Negative for abdominal pain, blood in stool, constipation, diarrhea, heartburn, melena, nausea and vomiting.  Genitourinary:  Negative for dysuria, flank pain, frequency, hematuria and urgency.  Musculoskeletal:  Negative for back pain, joint pain and myalgias.  Skin:  Negative for rash.  Neurological:  Negative for dizziness, tingling, focal weakness, seizures, weakness and headaches.  Endo/Heme/Allergies:  Does not bruise/bleed easily.  Psychiatric/Behavioral:  Negative for depression and suicidal ideas. The patient does not have insomnia.       Allergies  Allergen Reactions   Pork-Derived Products Anaphylaxis   Azithromycin     Other reaction(s): Other (See Comments)  Pt states that he has hiccups when he takes this medication   Ibuprofen      Past Medical History:  Diagnosis Date   Anemia    Arthritis    Cancer ([facility])    Diabetes ([facility]) 03/26/2013   GERD (gastroesophageal reflux  disease) 03/26/2013   Gout    History of hematuria    HTN (hypertension) 03/26/2013   Renal insufficiency      Past Surgical History:  Procedure Laterality Date   APPENDECTOMY     COLONOSCOPY WITH PROPOFOL N/A 11/18/2018   Procedure: COLONOSCOPY WITH PROPOFOL;  Surgeon: Toney Reil, MD;  Location: [facility] ENDOSCOPY;  Service: Gastroenterology;  Laterality: N/A;   ROBOTIC  ASSITED PARTIAL NEPHRECTOMY Left 08/29/2016   Procedure: ROBOTIC ASSITED PARTIAL NEPHRECTOMY;  Surgeon: [name], MD;  Location: [facility] ORS;  Service: Urology;  Laterality: Left;   UPPER GI ENDOSCOPY      Social History   Socioeconomic History   Marital status: Married    Spouse name: Not on file   Number of children: Not on file   Years of education: Not on file   Highest education level: Not on file  Occupational History   Not on file  Tobacco Use   Smoking status: Never   Smokeless tobacco: Never  Vaping Use   Vaping status: Never Used  Substance and Sexual Activity   Alcohol use: No   Drug use: No   Sexual activity: Not Currently  Other Topics Concern   Not on file  Social History Narrative   Not on file   Social Determinants of Health   Financial Resource Strain: Low Risk  (01/20/2021)   Overall Financial Resource Strain (CARDIA)    Difficulty of Paying Living Expenses: Not hard at all  Food Insecurity: No Food Insecurity (01/20/2021)   Hunger Vital Sign    Worried About Running Out of Food in the Last Year: Never true    Ran Out of Food in the Last Year: Never true  Transportation Needs: No Transportation Needs (01/20/2021)   PRAPARE - Administrator, Civil Service (Medical): No    Lack of Transportation (Non-Medical): No  Physical Activity: Insufficiently Active (01/20/2021)   Exercise Vital Sign    Days of Exercise per Week: 3 days    Minutes of Exercise per Session: 30 min  Stress: No Stress Concern Present (01/20/2021)   [employer] of Occupational Health - Occupational Stress Questionnaire    Feeling of Stress : Not at all  Social Connections: Moderately Integrated (01/20/2021)   Social Connection and Isolation Panel [NHANES]    Frequency of Communication with Friends and Family: More than three times a week    Frequency of Social Gatherings with Friends and Family: More than three times a week    Attends Religious Services: More  than 4 times per year    Active Member of [employer] or Organizations: No    Attends Banker Meetings: Never    Marital Status: Married  Catering manager Violence: Not At Risk (01/20/2021)   Humiliation, Afraid, Rape, and Kick questionnaire    Fear of Current or Ex-Partner: No    Emotionally Abused: No    Physically Abused: No    Sexually Abused: No    Family History  Problem Relation Age of Onset   Kidney cancer Mother    Prostate cancer Neg Hx    Bladder Cancer Neg Hx      Current Outpatient Medications:    acetaminophen (TYLENOL) 500 MG tablet, Take 500 mg by mouth every 6 (six) hours as needed., Disp: , Rfl:    ALLERGY RELIEF 10 MG tablet, Take 10 mg by mouth daily., Disp: , Rfl:    amLODipine (NORVASC) 5 MG tablet, Take 1 tablet (5 mg total)  by mouth daily., Disp: 90 tablet, Rfl: 3   aspirin EC 81 MG tablet, Take 81 mg by mouth daily. Swallow whole., Disp: , Rfl:    benazepril (LOTENSIN) 20 MG tablet, TAKE 1 TABLET BY MOUTH DAILY AT BEDTIME, Disp: 90 tablet, Rfl: 3   cholecalciferol (VITAMIN D) 1000 units tablet, Take 1,000 Units by mouth daily., Disp: , Rfl:    FLUoxetine (PROZAC) 10 MG capsule, Take 10 mg by mouth daily., Disp: , Rfl:    omeprazole (PRILOSEC) 20 MG capsule, Take 20 mg by mouth., Disp: , Rfl:    rosuvastatin (CRESTOR) 20 MG tablet, Take 1 tablet (20 mg total) by mouth daily., Disp: 90 tablet, Rfl: 0   sitaGLIPtin-metformin (JANUMET) 50-500 MG tablet, Take 1 tablet by mouth 2 (two) times daily., Disp: 180 tablet, Rfl: 3   vitamin B-12 (CYANOCOBALAMIN) 500 MCG tablet, Take 500 mcg by mouth daily. (Patient not taking: Reported on 09/05/2022), Disp: , Rfl:   Physical exam:  Vitals:   09/05/22 1544  BP: (!) 107/55  Pulse: 73  Resp: 18  Temp: (!) 97.1 F (36.2 C)  TempSrc: Tympanic  SpO2: 100%  Weight: 115 lb 6.4 oz (52.3 kg)  Height: 5\' 4"  (1.626 m)   Physical Exam Cardiovascular:     Rate and Rhythm: Normal rate and regular rhythm.     Heart  sounds: Normal heart sounds.  Pulmonary:     Effort: Pulmonary effort is normal.     Breath sounds: Normal breath sounds.  Skin:    General: Skin is warm and dry.  Neurological:     Mental Status: He is alert and oriented to person, place, and time.         Latest Ref Rng & Units 07/25/2022    3:34 PM  CMP  Creatinine 0.61 - 1.24 mg/dL 4.09       Latest Ref Rng & Units 09/05/2022    3:33 PM  CBC  WBC 4.0 - 10.5 K/uL 8.3   Hemoglobin 13.0 - 17.0 g/dL 81.1   Hematocrit 91.4 - 52.0 % 42.6   Platelets 150 - 400 K/uL 278     No images are attached to the encounter.  Chest 1 View  Result Date: 08/22/2022 CLINICAL DATA:  kidney cancer, surveillance EXAM: CHEST  1 VIEW COMPARISON:  06/14/2021. FINDINGS: The heart size and mediastinal contours are within normal limits. Linear scarring or subsegmental atelectasis in the left base. Lungs are otherwise clear. No pneumothorax or pleural effusion. There are thoracic degenerative changes. IMPRESSION: Minimal left basilar scar or subsegmental atelectasis. Otherwise no acute cardiopulmonary process. Electronically Signed   By: Layla Maw M.D.   On: 08/22/2022 08:08     Assessment and plan- Patient is a 72 y.o. male here for routine follow-up of iron and B12 deficiency anemia  Patient is not presently anemic with an H&H of 14/42.6.  Iron studies are normal.  B12 levels are low at 193 and have encouraged him to take oral B12 1000 mcg daily.  Given stability of his counts we will repeat CBC ferritin and iron studies and B12 levels in 6 months in 1 year and I will see him back in 1 year   Visit Diagnosis 1. Iron deficiency anemia, unspecified iron deficiency anemia type   2. Other iron deficiency anemia      Dr. Owens Shark, MD, MPH [facility] at [facility] [phone] 09/10/2022 3:32 PM

## 2022-09-15 IMAGING — CT CT ABD-PELV W/ CM
2 of 5 series · 16 of 46 positions shown, 18 images · IV contrast (APPLIED)
Comparison: 03/12/2020

CLINICAL DATA: Left-sided renal cell carcinoma. Asymptomatic. Prior
nephrectomy. * Tracking Code: BO *

EXAM:
CT ABDOMEN AND PELVIS WITH CONTRAST
TECHNIQUE: Multidetector CT imaging of the abdomen and pelvis was performed
using the standard protocol following bolus administration of
intravenous contrast.

[Series 2: routine abd/pel with · axial · 0.63mm/px · z∈[-466,-66]mm · 13 of 92 slices shown, 15 images]
[im 6/92  soft-tissue]
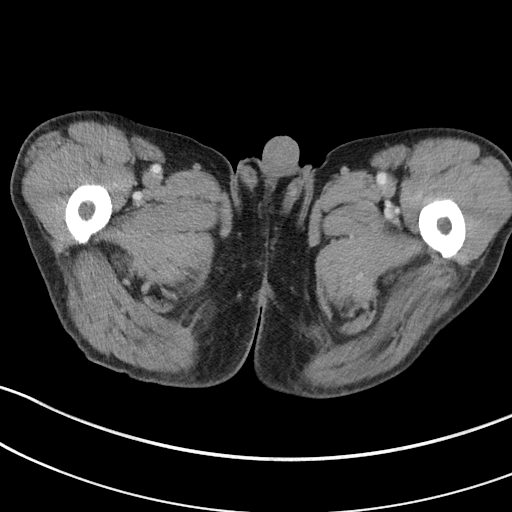
[im 6/92  bone]
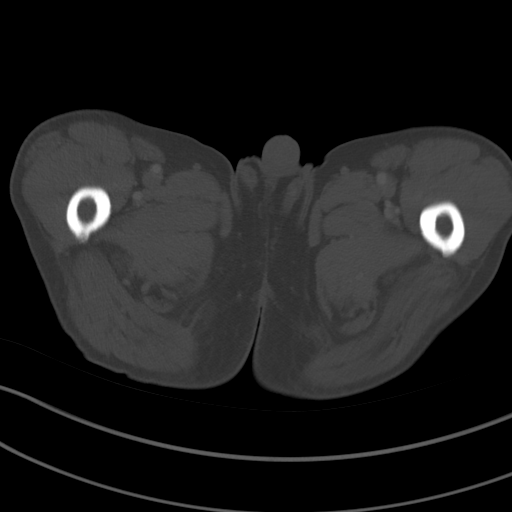
[im 11/92  soft-tissue]
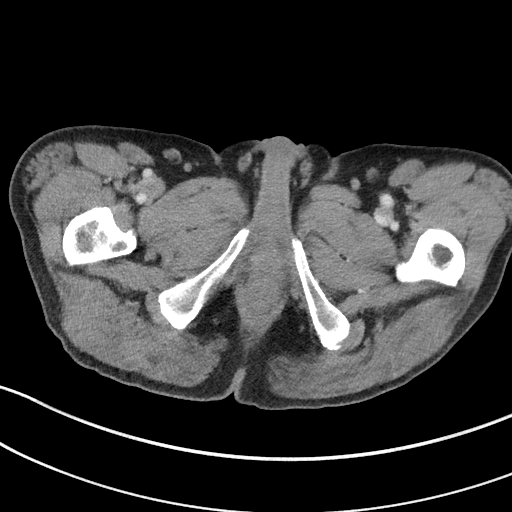
[im 21/92  soft-tissue]
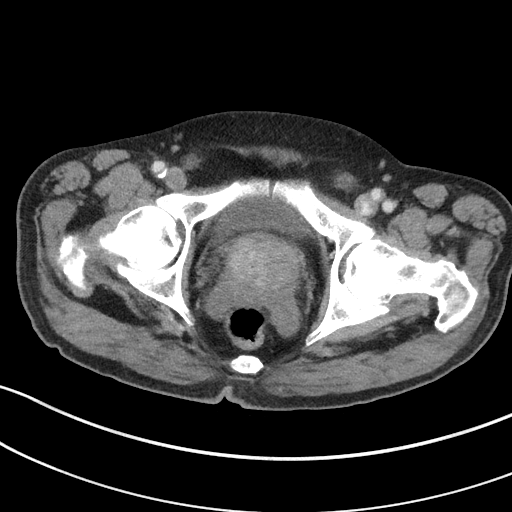
[im 26/92  soft-tissue]
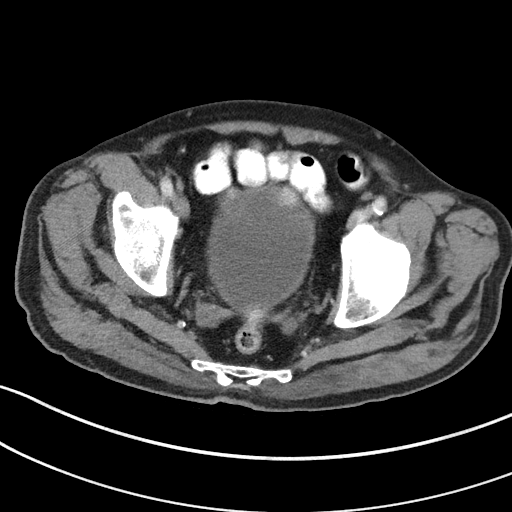
[im 31/92  soft-tissue]
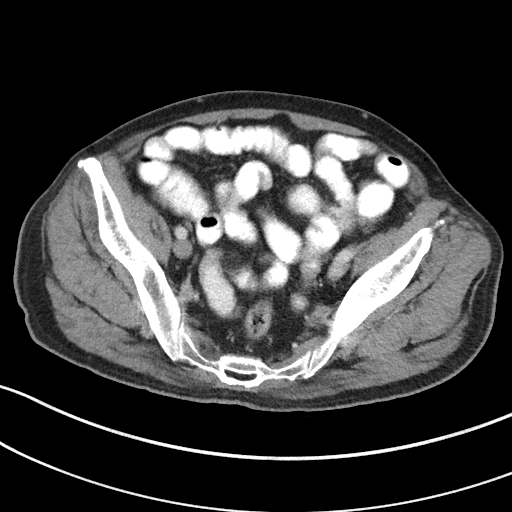
[im 41/92  soft-tissue]
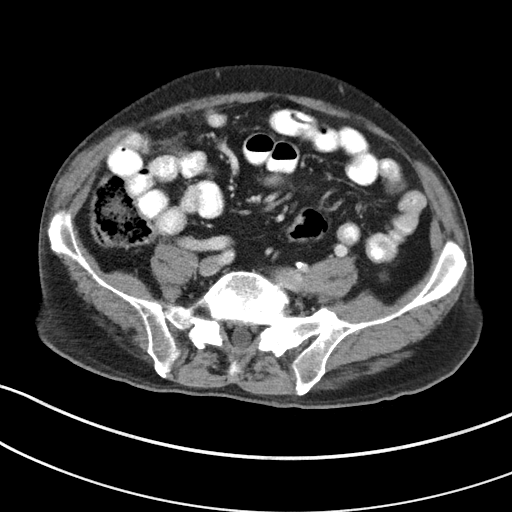
[im 46/92  soft-tissue]
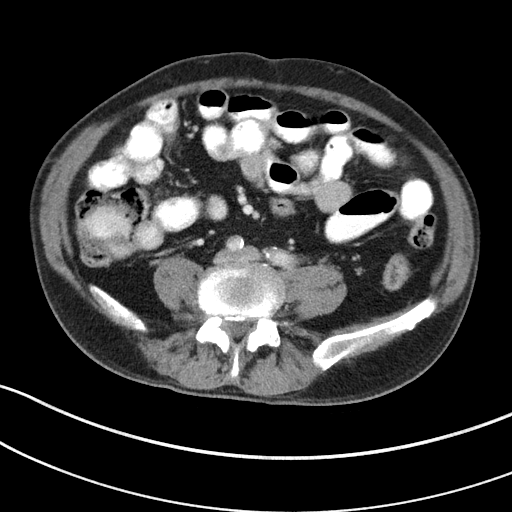
[im 51/92  soft-tissue]
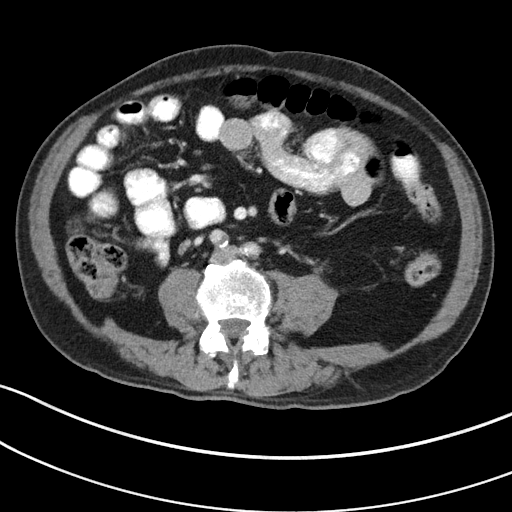
[im 61/92  soft-tissue]
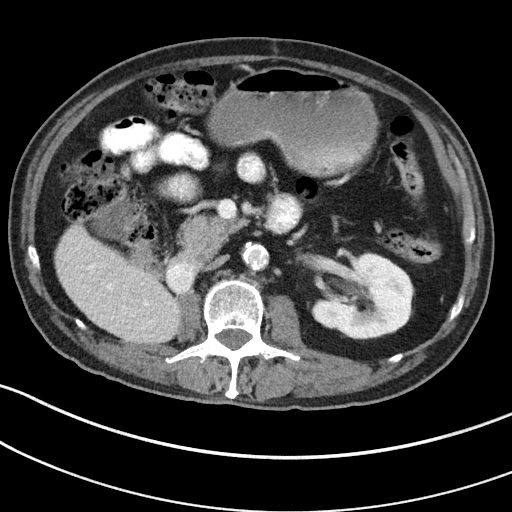
[im 61/92  bone]
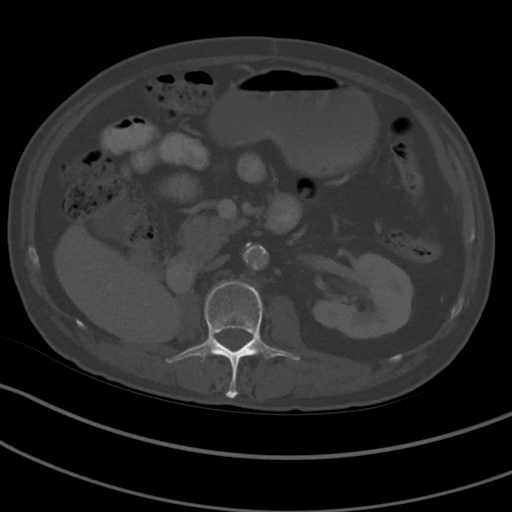
[im 66/92  soft-tissue]
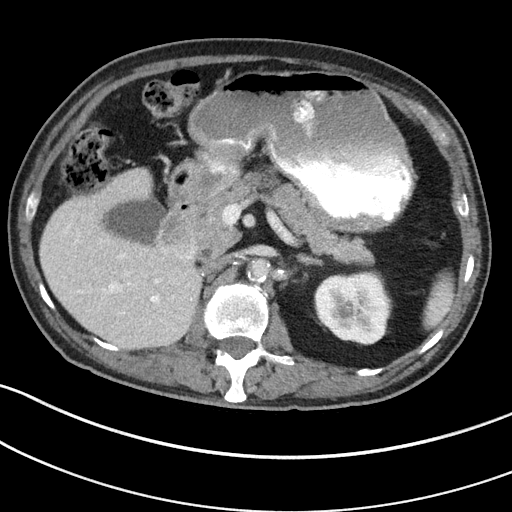
[im 71/92  soft-tissue]
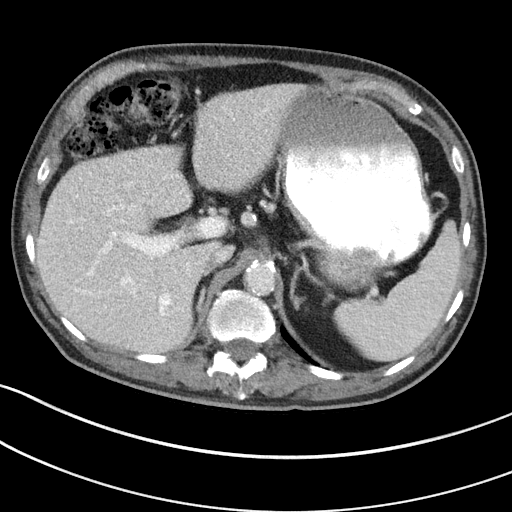
[im 81/92  soft-tissue]
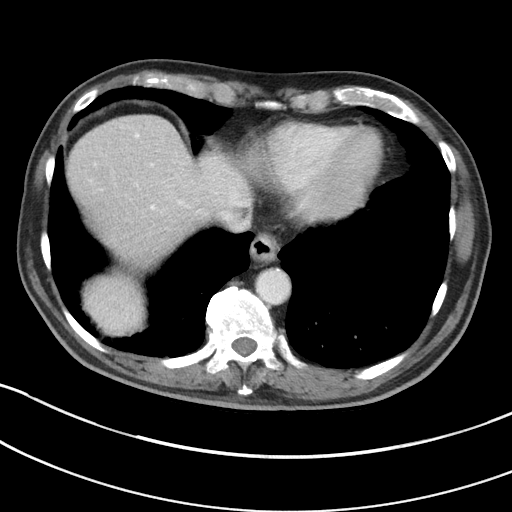
[im 86/92  soft-tissue]
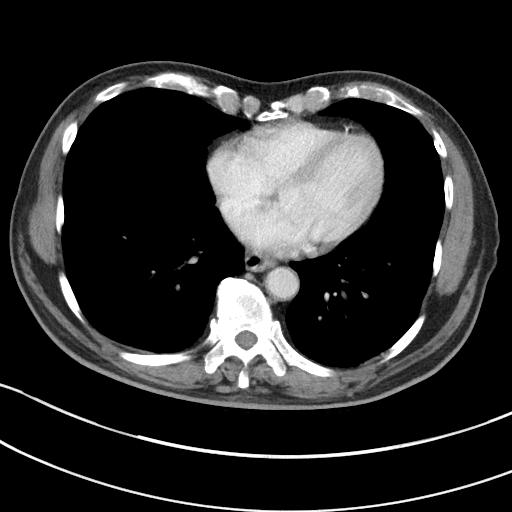

[Series 5: coronal st · coronal · 0.67mm/px · 3 of 76 slices shown]
[im 26/76  soft-tissue]
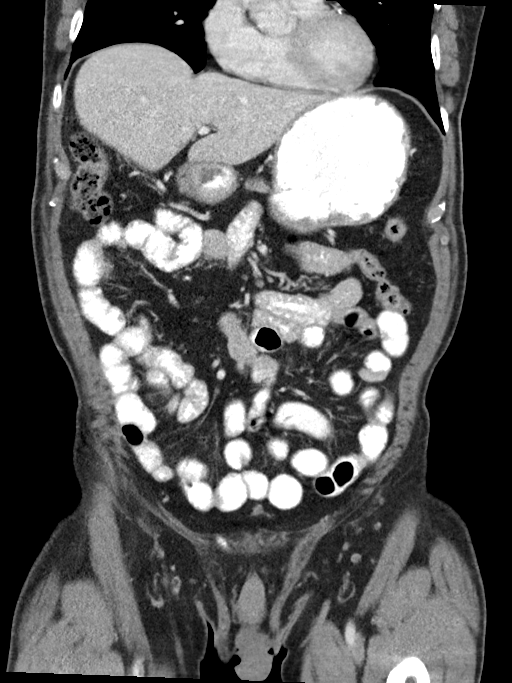
[im 34/76  soft-tissue]
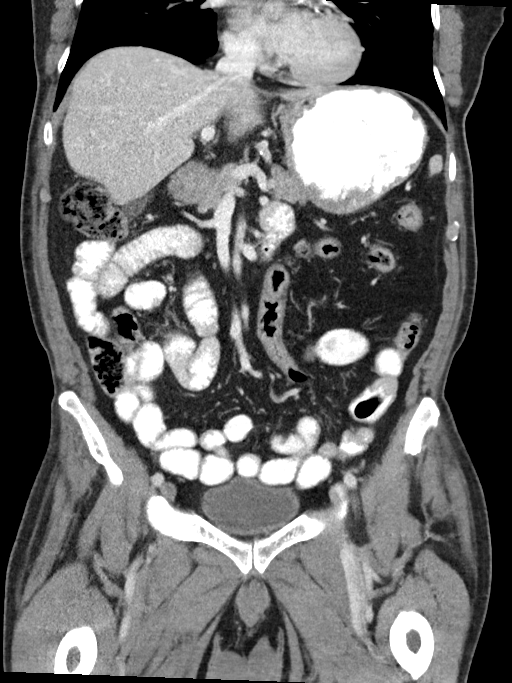
[im 42/76  soft-tissue]
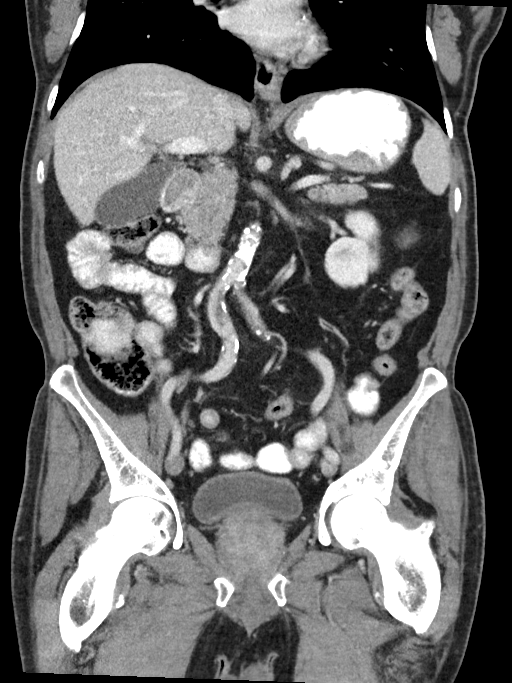

[16 of 46 positions shown; findings below may reference images not displayed]

RADIATION DOSE REDUCTION: This exam was performed according to the
departmental dose-optimization program which includes automated
exposure control, adjustment of the mA and/or kV according to
patient size and/or use of iterative reconstruction technique.

CONTRAST:  80mL OMNIPAQUE IOHEXOL 300 MG/ML  SOLN
FINDINGS: Lower chest: Clear lung bases. Normal heart size without pericardial
or pleural effusion. Three vessel coronary artery calcification.

Hepatobiliary: Normal liver. Tiny gallstone without acute
cholecystitis or biliary duct dilatation.

Pancreas: Normal, without mass or ductal dilatation.

Spleen: Normal in size, without focal abnormality.

Adrenals/Urinary Tract: Normal adrenal glands. Suspect a
subcentimeter upper pole left renal hypoattenuating lesion centrally
which is too small to characterize but most likely a cyst. Slightly
more distinct today. No suspicious left renal mass. Right
nephrectomy, without local recurrence.

Stomach/Bowel: Normal stomach, without wall thickening. Normal colon
and terminal ileum. Normal small bowel.

Vascular/Lymphatic: Advanced aortic and branch vessel
atherosclerosis. No abdominopelvic adenopathy.

Reproductive: Mild prostatomegaly.

Other: No significant free fluid.

Musculoskeletal: No acute osseous abnormality.
IMPRESSION: 1. Status post right nephrectomy, without recurrent or metastatic
disease.
2. Cholelithiasis
3. Coronary artery atherosclerosis. Aortic Atherosclerosis
([id number]).

## 2022-10-10 ENCOUNTER — Ambulatory Visit: Payer: Medicare Other | Admitting: Urology

## 2022-10-10 VITALS — BP 134/72 | HR 80 | Ht 64.0 in | Wt 115.0 lb

## 2022-10-10 DIAGNOSIS — Z85528 Personal history of other malignant neoplasm of kidney: Secondary | ICD-10-CM | POA: Diagnosis not present

## 2022-10-10 DIAGNOSIS — N189 Chronic kidney disease, unspecified: Secondary | ICD-10-CM | POA: Diagnosis not present

## 2022-10-10 DIAGNOSIS — R972 Elevated prostate specific antigen [PSA]: Secondary | ICD-10-CM

## 2022-10-10 NOTE — Progress Notes (Signed)
Ricky Jarvis,acting as a scribe for Ricky Scotland, MD.,have documented all relevant documentation on the behalf of Ricky Scotland, MD,as directed by  Ricky Scotland, MD while in the presence of Ricky Scotland, MD.  10/10/2022 4:33 PM   Ricky Jarvis 14-Dec-1950 [id number]  Referring provider: Corky Downs, MD [address] [place],  [place] [address]  Chief Complaint  Patient presents with   Follow-up    HPI: 72 year-old male with a personal history of bilateral renal cell carcinoma who returns today for a routine annual follow up. He is status post left robotic partial nephrectomy and right nephrectomy.  He is s/p left robotic partial nephrectomy 08/2016 and more recently right robotic nephrectomy 05/2017, P T3a.   We have been following a lesion, subcentimeters in the left kidney that was previously too small to characterize.   He had a CT abdomen pelvis with contrast on 07/30/2022 that just showed prostatomegaly. There were no suspicious renal lesions on the study. His chest XR was also negative.   He also has a personal history of elevated PSA. He had a prostate biopsy in 2020 that was negative. His PSA is in the 12 range. He had a prostate MRI in 2021 that showed a PI-RADS 2 lesion.   His most recent PSA on 07/18/2022 was 10.5.   Today, he is relatively asymptomatic and reports no new issues.    PMH: Past Medical History:  Diagnosis Date   Anemia    Arthritis    Cancer (HCC)    Diabetes (HCC) 03/26/2013   GERD (gastroesophageal reflux disease) 03/26/2013   Gout    History of hematuria    HTN (hypertension) 03/26/2013   Renal insufficiency     Surgical History: Past Surgical History:  Procedure Laterality Date   APPENDECTOMY     COLONOSCOPY WITH PROPOFOL N/A 11/18/2018   Procedure: COLONOSCOPY WITH PROPOFOL;  Surgeon: Toney Reil, MD;  Location: [facility];  Service: Gastroenterology;  Laterality: N/A;   ROBOTIC ASSITED PARTIAL NEPHRECTOMY Left 08/29/2016    Procedure: ROBOTIC ASSITED PARTIAL NEPHRECTOMY;  Surgeon: Ricky Scotland, MD;  Location: [facility];  Service: Urology;  Laterality: Left;   UPPER GI ENDOSCOPY      Home Medications:  Allergies as of 10/10/2022       Reactions   Pork-derived Products Anaphylaxis   Azithromycin    Other reaction(s): Other (See Comments)  Pt states that he has hiccups when he takes this medication   Ibuprofen         Medication List        Accurate as of October 10, 2022  4:33 PM. If you have any questions, ask your nurse or doctor.          acetaminophen 500 MG tablet Commonly known as: TYLENOL Take 500 mg by mouth every 6 (six) hours as needed.   Allergy Relief 10 MG tablet Generic drug: loratadine Take 10 mg by mouth daily.   amLODipine 5 MG tablet Commonly known as: NORVASC Take 1 tablet (5 mg total) by mouth daily.   aspirin EC 81 MG tablet Take 81 mg by mouth daily. Swallow whole.   benazepril 20 MG tablet Commonly known as: LOTENSIN TAKE 1 TABLET BY MOUTH DAILY AT BEDTIME   cetirizine 10 MG tablet Commonly known as: ZYRTEC Take 10 mg by mouth daily.   cholecalciferol 1000 units tablet Commonly known as: VITAMIN D Take 1,000 Units by mouth daily.   cyanocobalamin 500 MCG tablet Commonly known as: VITAMIN  B12 Take 500 mcg by mouth daily.   FLUoxetine 10 MG capsule Commonly known as: PROZAC Take 10 mg by mouth daily.   Janumet 50-500 MG tablet Generic drug: sitaGLIPtin-metformin Take 1 tablet by mouth 2 (two) times daily.   latanoprost 0.005 % ophthalmic solution Commonly known as: XALATAN SMARTSIG:In Eye(s)   meloxicam 15 MG tablet Commonly known as: MOBIC Take 1 tablet every day by oral route with meals.   omeprazole 20 MG capsule Commonly known as: PRILOSEC Take 20 mg by mouth.   rosuvastatin 20 MG tablet Commonly known as: CRESTOR Take 1 tablet (20 mg total) by mouth daily.        Allergies:  Allergies  Allergen Reactions   Pork-Derived  Products Anaphylaxis   Azithromycin     Other reaction(s): Other (See Comments)  Pt states that he has hiccups when he takes this medication   Ibuprofen     Family History: Family History  Problem Relation Age of Onset   Kidney cancer Mother    Prostate cancer Neg Hx    Bladder Cancer Neg Hx     Social History:  reports that he has never smoked. He has never used smokeless tobacco. He reports that he does not drink alcohol and does not use drugs.   Physical Exam: BP 134/72   Pulse 80   Ht 5\' 4"  (1.626 m)   Wt 115 lb (52.2 kg)   BMI 19.74 kg/m   Constitutional:  Alert and oriented, No acute distress. HEENT: [place] AT, moist mucus membranes.  Trachea midline, no masses. GU: Normal sphincter tone, markedly enlarged prostate, rubbery nodularity bilaterally consistent with BPH, no firm nodules. Neurologic: Grossly intact, no focal deficits, moving all 4 extremities. Psychiatric: Normal mood and affect.   Pertinent Imaging: EXAM: CT ABDOMEN AND PELVIS WITH CONTRAST   TECHNIQUE: Multidetector CT imaging of the abdomen and pelvis was performed using the standard protocol following bolus administration of intravenous contrast.   RADIATION DOSE REDUCTION: This exam was performed according to the departmental dose-optimization program which includes automated exposure control, adjustment of the mA and/or kV according to patient size and/or use of iterative reconstruction technique.   CONTRAST:  80mL OMNIPAQUE IOHEXOL 300 MG/ML  SOLN   COMPARISON:  05/25/2021   FINDINGS: Lower chest: Lung bases are clear.   Hepatobiliary: Liver is within normal limits.   Gallbladder is unremarkable. No intrahepatic or extrahepatic duct dilatation.   Pancreas: Within normal limits.   Spleen: Within normal limits.   Adrenals/Urinary Tract: Adrenal glands are within normal limits.   Status post right nephrectomy.   Status post left lower pole partial nephrectomy (series 9/image 22). No  residual renal mass or cyst. No hydronephrosis.   Stomach/Bowel: Stomach is within normal limits.   No evidence of bowel obstruction.   Appendix is not discretely visualized.   No colonic wall thickening or inflammatory changes.   Vascular/Lymphatic: No evidence of abdominal aortic aneurysm.   Atherosclerotic calcifications of the abdominal aorta and branch vessels.   No suspicious abdominopelvic lymphadenopathy.   Reproductive: Prostatomegaly, with enlargement of the central gland indenting the base of the bladder, suggesting BPH.   Other: No abdominopelvic ascites.   Musculoskeletal: Degenerative changes of the visualized thoracolumbar spine.   IMPRESSION: Status post right nephrectomy and left lower pole partial nephrectomy.   No evidence of recurrent or metastatic disease.   Prostatomegaly, suggesting BPH.     Electronically Signed   By: Charline Bills M.D.   On: 07/30/2022 02:39  This was personally reviewed and I agree with the radiologic interpretation.  Assessment & Plan:    1. History of renal cell carcinoma - No evidence of disease - He has no completed 5 years of surveillance for this. - His last recurrence was in 2019 - No further imaging indicated for a history of renal cell carcinoma  2. Elevated PSA - His most recent PSA was 10.5 in 07/2022 - Stable in his previous range - Continue to follow it annually - Likely normal based on his size and he is relatively asymptomatic from that  3. CKD -  baseline creatinine is around 1.4, with minimal change   Return in about 1 year (around 10/10/2023) for repeat PSA.  I have reviewed the above documentation for accuracy and completeness, and I agree with the above.   Ricky Scotland, MD  [facility] [address] [place], [place] [address] [phone]

## 2023-03-08 ENCOUNTER — Inpatient Hospital Stay: Payer: Medicare Other

## 2023-03-09 ENCOUNTER — Inpatient Hospital Stay: Payer: Medicare Other | Attending: Oncology

## 2023-03-09 DIAGNOSIS — D508 Other iron deficiency anemias: Secondary | ICD-10-CM

## 2023-03-09 DIAGNOSIS — D519 Vitamin B12 deficiency anemia, unspecified: Secondary | ICD-10-CM | POA: Insufficient documentation

## 2023-03-09 DIAGNOSIS — D509 Iron deficiency anemia, unspecified: Secondary | ICD-10-CM | POA: Diagnosis present

## 2023-03-09 LAB — VITAMIN B12: Vitamin B-12: 370 pg/mL (ref 180–914)

## 2023-03-09 LAB — IRON AND TIBC
Iron: 77 ug/dL (ref 45–182)
Saturation Ratios: 18 % (ref 17.9–39.5)
TIBC: 423 ug/dL (ref 250–450)
UIBC: 346 ug/dL

## 2023-03-09 LAB — CBC
HCT: 45.6 % (ref 39.0–52.0)
Hemoglobin: 15 g/dL (ref 13.0–17.0)
MCH: 29.1 pg (ref 26.0–34.0)
MCHC: 32.9 g/dL (ref 30.0–36.0)
MCV: 88.5 fL (ref 80.0–100.0)
Platelets: 296 10*3/uL (ref 150–400)
RBC: 5.15 MIL/uL (ref 4.22–5.81)
RDW: 12.9 % (ref 11.5–15.5)
WBC: 9.1 10*3/uL (ref 4.0–10.5)
nRBC: 0 % (ref 0.0–0.2)

## 2023-03-09 LAB — FERRITIN: Ferritin: 168 ng/mL (ref 24–336)

## 2023-05-27 ENCOUNTER — Ambulatory Visit
Admission: EM | Admit: 2023-05-27 | Discharge: 2023-05-27 | Disposition: A | Discharge status: Home / Self Care | Attending: Family Medicine | Admitting: Family Medicine

## 2023-05-27 ENCOUNTER — Encounter: Payer: Self-pay | Admitting: Oncology

## 2023-05-27 DIAGNOSIS — J029 Acute pharyngitis, unspecified: Secondary | ICD-10-CM | POA: Insufficient documentation

## 2023-05-27 DIAGNOSIS — H6121 Impacted cerumen, right ear: Secondary | ICD-10-CM | POA: Diagnosis present

## 2023-05-27 LAB — GROUP A STREP BY PCR: Group A Strep by PCR: NOT DETECTED

## 2023-05-27 MED ORDER — AMOXICILLIN-POT CLAVULANATE 875-125 MG PO TABS: 1.0000 | ORAL_TABLET | Freq: Two times a day (BID) | ORAL | 0 refills | Status: DC

## 2023-05-27 NOTE — ED Triage Notes (Signed)
 Three days ago - fell backwards while doing yard work Shoulder and neck pain

## 2023-05-27 NOTE — ED Provider Notes (Signed)
 [facility]    CSN: [id number] Arrival date & time: 05/27/23  1355      History   Chief Complaint Chief Complaint  Patient presents with   Sore Throat   Fall   Shoulder Pain    HPI Ricky Jarvis is a 73 y.o. male.   HPI  History obtained from  patient and his son-in-law .  Ricky Jarvis presents for sore throat for a week with painful swallowing. Has some left ear pain. Son-in-law found out about him being ill. No fever, vomiting, diarrhea, headache.    He fell 3-4 days ago and has generalized body aches. Has been taking muscle relaxers that were previously prescribed which seem to help. Meloxicam andTylenol helps too.         Past Medical History:  Diagnosis Date   Anemia    Arthritis    Cancer (HCC)    Diabetes (HCC) 03/26/2013   GERD (gastroesophageal reflux disease) 03/26/2013   Gout    History of hematuria    HTN (hypertension) 03/26/2013   Renal insufficiency     Patient Active Problem List   Diagnosis Date Noted   Vertigo 09/08/2020   COVID 07/19/2020   Iron  deficiency anemia 10/06/2019   Urinary frequency 09/01/2016   Left renal mass 08/29/2016   Diabetes (HCC) 03/26/2013   GERD (gastroesophageal reflux disease) 03/26/2013   Hiccups 03/26/2013   HTN (hypertension) 03/26/2013    Past Surgical History:  Procedure Laterality Date   APPENDECTOMY     COLONOSCOPY WITH PROPOFOL  N/A 11/18/2018   Procedure: COLONOSCOPY WITH PROPOFOL ;  Surgeon: Selena Daily, MD;  Location: [facility];  Service: Gastroenterology;  Laterality: N/A;   ROBOTIC ASSITED PARTIAL NEPHRECTOMY Left 08/29/2016   Procedure: ROBOTIC ASSITED PARTIAL NEPHRECTOMY;  Surgeon: Dustin Gimenez, MD;  Location: [facility];  Service: Urology;  Laterality: Left;   UPPER GI ENDOSCOPY         Home Medications    Prior to Admission medications   Medication Sig Start Date End Date Taking? Authorizing Provider  amoxicillin -clavulanate (AUGMENTIN ) 875-125 MG tablet Take 1 tablet  by mouth every 12 (twelve) hours. 05/27/23  Yes Demorris Choyce, DO  acetaminophen  (TYLENOL ) 500 MG tablet Take 500 mg by mouth every 6 (six) hours as needed.    [provider]  ALLERGY RELIEF 10 MG tablet Take 10 mg by mouth daily. 11/03/19   [provider]  amLODipine  (NORVASC ) 5 MG tablet Take 1 tablet (5 mg total) by mouth daily. 07/22/21   Theron Flavin, MD  aspirin EC 81 MG tablet Take 81 mg by mouth daily. Swallow whole.    [provider]  benazepril  (LOTENSIN ) 20 MG tablet TAKE 1 TABLET BY MOUTH DAILY AT BEDTIME 01/10/21   Masoud, Javed, MD  cetirizine (ZYRTEC) 10 MG tablet Take 10 mg by mouth daily. 04/20/22   [provider]  cholecalciferol (VITAMIN D) 1000 units tablet Take 1,000 Units by mouth daily.    [provider]  FLUoxetine (PROZAC) 10 MG capsule Take 10 mg by mouth daily. 01/13/20   [provider]  latanoprost (XALATAN) 0.005 % ophthalmic solution SMARTSIG:In Eye(s) 10/06/22   [provider]  meloxicam (MOBIC) 15 MG tablet Take 1 tablet every day by oral route with meals. 11/27/16   [provider]  omeprazole (PRILOSEC) 20 MG capsule Take 20 mg by mouth.    [provider]  rosuvastatin  (CRESTOR ) 20 MG tablet Take 1 tablet (20 mg total) by mouth daily. 01/11/22 09/05/22  Theron Flavin, MD  sitaGLIPtin-metformin (JANUMET ) 50-500 MG tablet Take 1 tablet by mouth 2 (two) times daily. 01/21/21   Masoud, Javed, MD  vitamin B-12 (CYANOCOBALAMIN ) 500 MCG tablet Take 500 mcg by mouth daily.    [provider]    Family History Family History  Problem Relation Age of Onset   Kidney cancer Mother    Prostate cancer Neg Hx    Bladder Cancer Neg Hx     Social History Social History   Tobacco Use   Smoking status: Never   Smokeless tobacco: Never  Vaping Use   Vaping status: Never Used  Substance Use Topics   Alcohol use: No   Drug use: No     Allergies   Pork-derived products,  Azithromycin, and Ibuprofen   Review of Systems Review of Systems: negative unless otherwise stated in HPI.      Physical Exam Triage Vital Signs ED Triage Vitals  Encounter Vitals Group     BP 05/27/23 1415 123/71     Systolic BP Percentile --      Diastolic BP Percentile --      Pulse Rate 05/27/23 1415 84     Resp 05/27/23 1415 18     Temp 05/27/23 1415 98 F (36.7 C)     Temp Source 05/27/23 1415 Oral     SpO2 05/27/23 1415 94 %     Weight --      Height --      Head Circumference --      Peak Flow --      Pain Score 05/27/23 1411 5     Pain Loc --      Pain Education --      Exclude from Growth Chart --    No data found.  Updated Vital Signs BP 123/71 (BP Location: Right Arm)   Pulse 84   Temp 98 F (36.7 C) (Oral)   Resp 18   SpO2 94%   Visual Acuity Right Eye Distance:   Left Eye Distance:   Bilateral Distance:    Right Eye Near:   Left Eye Near:    Bilateral Near:     Physical Exam GEN:     alert, non-toxic appearing male in no distress    HENT:  mucus membranes moist, oropharyngeal without lesions, moderate erythema, no tonsillar hypertrophy or exudates, no nasal discharge, left TM perforation, right TM not visible due to cerumen impaction EYES:   no scleral injection or discharge NECK:  normal ROM, +lymphadenopathy, no meningismus   RESP:  no increased work of breathing, clear to auscultation bilaterally CVS:   regular rate and rhythm MSK: good ROM  Skin:   warm and dry    UC Treatments / Results  Labs (all labs ordered are listed, but only abnormal results are displayed) Labs Reviewed  GROUP A STREP BY PCR    EKG   Radiology No results found.  Procedures Procedures (including critical care time) Procedures Ear Cerumen Removal   Date/Time: 01/20/2019 9:08 PM Performed by: nursing staff Authorized by: Fidel Huddle, DO   Consent:    Consent obtained:  Verbal   Consent given by:  Patient   Risks discussed:  Bleeding,  dizziness, infection, incomplete removal, TM perforation and pain   Alternatives discussed:  No treatment Procedure details:    Location:  Right ear   Procedure type: irrigation   Post-procedure details:    Inspection:  TM not visible due to cerumen impaction   Hearing quality:  Unchanged   Patient tolerance of procedure:  Tolerated well, no immediate complications   Medications Ordered in UC Medications - No data to display  Initial Impression / Assessment and Plan / UC Course  I have reviewed the triage vital signs and the nursing notes.  Pertinent labs & imaging results that were available during my care of the patient were reviewed by me and considered in my medical decision making (see chart for details).       Pharyngitis Patient is a 73 y.o. male who presents for sore throat for the past week.  On exam, pharyngeal exam is erythematous but no exudates or tonsillar hypertrophy. Strep test is negative.  COVID deferred due to duration of symptoms.  Overall patient is non-toxic-appearing, well-hydrated and without respiratory distress. Pt is afebrile here.  Tylenol  as needed for discomfort.  Gargle with warm salt water.  Recommended to avoid anything that irritates histhroat.  Treat with Augmentin  twice daily for 7 days.  Stressed the importance of hydration.   Advised patient to be careful while using the muscle relaxer Robaxin as it may make him sleepy and cause more falls.  Voiced understanding.  Pt with ear pain. Ceruminosis is noted on the right.  Wax is removed by syringing debridement.  On reassessment, impaction improved but not cleared.  TM not visible.  Patient terminated procedure due to discomfort.  Instructions for home care to prevent wax buildup are given.  Discussed MDM, treatment plan and plan for follow-up with patient who agrees with plan.     Final Clinical Impressions(s) / UC Diagnoses   Final diagnoses:  Pharyngitis, unspecified etiology  Impacted cerumen  of right ear     Discharge Instructions      Strep PCR is negative. Stop by the pharmacy to pick up your prescriptions.  Follow up with your primary care provider or return to the urgent care, if not improving.   Consider picking up Debrox for earwax removal at home.        ED Prescriptions     Medication Sig Dispense Auth. Provider   amoxicillin -clavulanate (AUGMENTIN ) 875-125 MG tablet Take 1 tablet by mouth every 12 (twelve) hours. 14 tablet Valkyrie Guardiola, DO      PDMP not reviewed this encounter.   Artesha Wemhoff, DO 05/27/23 1532

## 2023-05-27 NOTE — Discharge Instructions (Addendum)
 Strep PCR is negative. Stop by the pharmacy to pick up your prescriptions.  Follow up with your primary care provider or return to the urgent care, if not improving.   Consider picking up Debrox for earwax removal at home.

## 2023-05-27 NOTE — ED Triage Notes (Signed)
 Throat pain Hurts to swallow

## 2023-09-05 ENCOUNTER — Other Ambulatory Visit: Payer: Medicare Other

## 2023-09-05 ENCOUNTER — Ambulatory Visit: Payer: Medicare Other | Admitting: Oncology

## 2023-09-12 ENCOUNTER — Inpatient Hospital Stay: Attending: Oncology

## 2023-09-12 ENCOUNTER — Other Ambulatory Visit: Payer: Self-pay

## 2023-09-12 ENCOUNTER — Inpatient Hospital Stay (HOSPITAL_BASED_OUTPATIENT_CLINIC_OR_DEPARTMENT_OTHER): Admitting: Oncology

## 2023-09-12 ENCOUNTER — Encounter: Payer: Self-pay | Admitting: Oncology

## 2023-09-12 VITALS — BP 133/71 | HR 72 | Temp 96.9°F | Resp 16 | Ht 63.0 in | Wt 115.5 lb

## 2023-09-12 DIAGNOSIS — D519 Vitamin B12 deficiency anemia, unspecified: Secondary | ICD-10-CM | POA: Diagnosis present

## 2023-09-12 DIAGNOSIS — D508 Other iron deficiency anemias: Secondary | ICD-10-CM

## 2023-09-12 DIAGNOSIS — D509 Iron deficiency anemia, unspecified: Secondary | ICD-10-CM | POA: Insufficient documentation

## 2023-09-12 LAB — CBC
HCT: 43.7 % (ref 39.0–52.0)
Hemoglobin: 14.6 g/dL (ref 13.0–17.0)
MCH: 30.1 pg (ref 26.0–34.0)
MCHC: 33.4 g/dL (ref 30.0–36.0)
MCV: 90.1 fL (ref 80.0–100.0)
Platelets: 283 K/uL (ref 150–400)
RBC: 4.85 MIL/uL (ref 4.22–5.81)
RDW: 12.4 % (ref 11.5–15.5)
WBC: 8.3 K/uL (ref 4.0–10.5)
nRBC: 0 % (ref 0.0–0.2)

## 2023-09-12 LAB — VITAMIN B12: Vitamin B-12: 450 pg/mL (ref 180–914)

## 2023-09-12 LAB — IRON AND TIBC
Iron: 95 ug/dL (ref 45–182)
Saturation Ratios: 25 % (ref 17.9–39.5)
TIBC: 377 ug/dL (ref 250–450)
UIBC: 282 ug/dL

## 2023-09-12 LAB — FERRITIN: Ferritin: 122 ng/mL (ref 24–336)

## 2023-09-12 NOTE — Progress Notes (Signed)
 sad due to brother passing away, has trouble sleeping.

## 2023-09-12 NOTE — Progress Notes (Signed)
 Hematology/Oncology Consult note [facility]  [phone] [phone]  Patient Care Team: Britta King, MD as PCP - General (Internal Medicine) Melanee Annah BROCKS, MD as Consulting Physician (Oncology)   Name of the patient: Ricky Jarvis  [id number]  Jan 09, 1951   Date of visit: 09/12/23  Diagnosis-iron  and B12 deficiency anemia  Chief complaint/ Reason for visit-routine follow-up of anemia  Heme/Onc history: patient is a 73 year old gentleman from [place] who speaks Urdu and limited [place].  I was able to converse with him in Hindi which is a language both of us  can understand comprehend well. His past medical history significant for hypertension hyperlipidemia and chronic kidney disease.  He was referred for anemia.  Most recent CBC from 09/19/2019 showed white count of 7.4, H&H of 11.3/37.6 with an MCV of 77.8 and a platelet count of 352.  Serum creatinine elevated at 1.5.  Last iron  studies checked in March 2021 showed a ferritin level of 8 and elevated TIBC.  He has seen Dr. Kristan in the past and has undergone colonoscopy in October 2020 but has not undergone EGD.  Colonoscopy showed 2 small polyps in the rectum and cecum which were negative for malignancy.  No active bleeding was noted at that time.  Patient currently reports ongoing fatigue.  He has lost 4 pounds in the last 1 month.  He continues to be active and works 40 hours a week.      Results of blood work from 09/29/2019 were as follows: CBC showed white count of 7.9, H&H of 11.7/37.3 with an MCV of 75.7 and a platelet count of 361.  CMP was significant for elevated creatinine of 1.5.  Folate levels were normal.  B12 level was normal at 109.  Ferritin level was low at 7 with iron  studies showing elevated TIBC.  Myeloma panel showed polyclonal increase in immunoglobulins.  Haptoglobin and TSH was normal. Patient received IV iron  and B12 shots in September 2021    Interval  history-patient recently got back from [place] following death of his brother.  He reports insomnia since then.  Denies any blood loss in his stool or urine.  Energy levels have been stable.  ECOG PS- 1 Pain scale- 0  Review of systems- Review of Systems  Constitutional:  Negative for chills, fever, malaise/fatigue and weight loss.  HENT:  Negative for congestion, ear discharge and nosebleeds.   Eyes:  Negative for blurred vision.  Respiratory:  Negative for cough, hemoptysis, sputum production, shortness of breath and wheezing.   Cardiovascular:  Negative for chest pain, palpitations, orthopnea and claudication.  Gastrointestinal:  Negative for abdominal pain, blood in stool, constipation, diarrhea, heartburn, melena, nausea and vomiting.  Genitourinary:  Negative for dysuria, flank pain, frequency, hematuria and urgency.  Musculoskeletal:  Negative for back pain, joint pain and myalgias.  Skin:  Negative for rash.  Neurological:  Negative for dizziness, tingling, focal weakness, seizures, weakness and headaches.  Endo/Heme/Allergies:  Does not bruise/bleed easily.  Psychiatric/Behavioral:  Negative for depression and suicidal ideas. The patient does not have insomnia.       Allergies  Allergen Reactions   Pork-Derived Products Anaphylaxis   Azithromycin     Other reaction(s): Other (See Comments)  Pt states that he has hiccups when he takes this medication   Ibuprofen      Past Medical History:  Diagnosis Date   Anemia    Arthritis    Cancer (HCC)    Diabetes (HCC) 03/26/2013  GERD (gastroesophageal reflux disease) 03/26/2013   Gout    History of hematuria    HTN (hypertension) 03/26/2013   Renal insufficiency      Past Surgical History:  Procedure Laterality Date   APPENDECTOMY     COLONOSCOPY WITH PROPOFOL  N/A 11/18/2018   Procedure: COLONOSCOPY WITH PROPOFOL ;  Surgeon: Unk Corinn Skiff, MD;  Location: [facility] ENDOSCOPY;  Service: Gastroenterology;  Laterality:  N/A;   ROBOTIC ASSITED PARTIAL NEPHRECTOMY Left 08/29/2016   Procedure: ROBOTIC ASSITED PARTIAL NEPHRECTOMY;  Surgeon: Penne Knee, MD;  Location: [facility] ORS;  Service: Urology;  Laterality: Left;   UPPER GI ENDOSCOPY      Social History   Socioeconomic History   Marital status: Married    Spouse name: Not on file   Number of children: Not on file   Years of education: Not on file   Highest education level: Not on file  Occupational History   Not on file  Tobacco Use   Smoking status: Never   Smokeless tobacco: Never  Vaping Use   Vaping status: Never Used  Substance and Sexual Activity   Alcohol use: No   Drug use: No   Sexual activity: Not Currently  Other Topics Concern   Not on file  Social History Narrative   Not on file   Social Drivers of Health   Financial Resource Strain: Low Risk  (01/20/2021)   Overall Financial Resource Strain (CARDIA)    Difficulty of Paying Living Expenses: Not hard at all  Food Insecurity: No Food Insecurity (01/20/2021)   Hunger Vital Sign    Worried About Running Out of Food in the Last Year: Never true    Ran Out of Food in the Last Year: Never true  Transportation Needs: No Transportation Needs (01/20/2021)   PRAPARE - Administrator, Civil Service (Medical): No    Lack of Transportation (Non-Medical): No  Physical Activity: Insufficiently Active (01/20/2021)   Exercise Vital Sign    Days of Exercise per Week: 3 days    Minutes of Exercise per Session: 30 min  Stress: No Stress Concern Present (01/20/2021)   [employer] of Occupational Health - Occupational Stress Questionnaire    Feeling of Stress : Not at all  Social Connections: Moderately Integrated (01/20/2021)   Social Connection and Isolation Panel    Frequency of Communication with Friends and Family: More than three times a week    Frequency of Social Gatherings with Friends and Family: More than three times a week    Attends Religious Services: More  than 4 times per year    Active Member of [employer] or Organizations: No    Attends Banker Meetings: Never    Marital Status: Married  Catering manager Violence: Not At Risk (01/20/2021)   Humiliation, Afraid, Rape, and Kick questionnaire    Fear of Current or Ex-Partner: No    Emotionally Abused: No    Physically Abused: No    Sexually Abused: No    Family History  Problem Relation Age of Onset   Kidney cancer Mother    Prostate cancer Neg Hx    Bladder Cancer Neg Hx      Current Outpatient Medications:    acetaminophen  (TYLENOL ) 500 MG tablet, Take 500 mg by mouth every 6 (six) hours as needed., Disp: , Rfl:    ALLERGY RELIEF 10 MG tablet, Take 10 mg by mouth daily., Disp: , Rfl:    amLODipine  (NORVASC ) 5 MG tablet, Take 1 tablet (5  mg total) by mouth daily., Disp: 90 tablet, Rfl: 3   aspirin EC 81 MG tablet, Take 81 mg by mouth daily. Swallow whole., Disp: , Rfl:    benazepril  (LOTENSIN ) 20 MG tablet, TAKE 1 TABLET BY MOUTH DAILY AT BEDTIME, Disp: 90 tablet, Rfl: 3   cetirizine (ZYRTEC) 10 MG tablet, Take 10 mg by mouth daily., Disp: , Rfl:    cholecalciferol (VITAMIN D) 1000 units tablet, Take 1,000 Units by mouth daily., Disp: , Rfl:    FLUoxetine (PROZAC) 10 MG capsule, Take 10 mg by mouth daily., Disp: , Rfl:    latanoprost (XALATAN) 0.005 % ophthalmic solution, SMARTSIG:In Eye(s), Disp: , Rfl:    meloxicam (MOBIC) 15 MG tablet, Take 1 tablet every day by oral route with meals., Disp: , Rfl:    omeprazole (PRILOSEC) 20 MG capsule, Take 20 mg by mouth., Disp: , Rfl:    rosuvastatin  (CRESTOR ) 20 MG tablet, Take 1 tablet (20 mg total) by mouth daily., Disp: 90 tablet, Rfl: 0   sitaGLIPtin-metformin (JANUMET ) 50-500 MG tablet, Take 1 tablet by mouth 2 (two) times daily., Disp: 180 tablet, Rfl: 3   vitamin B-12 (CYANOCOBALAMIN ) 500 MCG tablet, Take 500 mcg by mouth daily., Disp: , Rfl:    amoxicillin -clavulanate (AUGMENTIN ) 875-125 MG tablet, Take 1 tablet by mouth  every 12 (twelve) hours. (Patient not taking: Reported on 09/12/2023), Disp: 14 tablet, Rfl: 0  Physical exam:  Vitals:   09/12/23 1522 09/12/23 1523  BP: (!) 157/77 133/71  Pulse: 75 72  Resp: 16   Temp: (!) 96.9 F (36.1 C)   TempSrc: Tympanic   SpO2: 99%   Weight: 115 lb 8 oz (52.4 kg)   Height: 5' 3 (1.6 m)    Physical Exam Cardiovascular:     Rate and Rhythm: Normal rate and regular rhythm.     Heart sounds: Normal heart sounds.  Pulmonary:     Effort: Pulmonary effort is normal.     Breath sounds: Normal breath sounds.  Skin:    General: Skin is warm and dry.  Neurological:     Mental Status: He is alert and oriented to person, place, and time.      I have personally reviewed labs listed below:    Latest Ref Rng & Units 07/25/2022    3:34 PM  CMP  Creatinine 0.61 - 1.24 mg/dL 8.59       Latest Ref Rng & Units 09/12/2023    3:02 PM  CBC  WBC 4.0 - 10.5 K/uL 8.3   Hemoglobin 13.0 - 17.0 g/dL 85.3   Hematocrit 60.9 - 52.0 % 43.7   Platelets 150 - 400 K/uL 283       Assessment and plan- Patient is a 73 y.o. male here for routine follow-up of iron  and B12 deficiency anemia  Patient has not required IV iron  for over 2 years now.  Hemoglobin remained stable between 14-15.  Iron  studies and B12 levels from today are Pending.  Patient wishes to continue follow-up with me at this time and therefore I will see him back on a yearly basis   Visit Diagnosis 1. Iron  deficiency anemia, unspecified iron  deficiency anemia type      Dr. Annah Skene, MD, MPH [facility] [phone] 09/12/2023 4:00 PM

## 2023-10-05 ENCOUNTER — Other Ambulatory Visit: Payer: Self-pay

## 2023-10-09 ENCOUNTER — Ambulatory Visit: Admitting: Urology

## 2023-10-09 ENCOUNTER — Ambulatory Visit: Payer: Self-pay | Admitting: Urology

## 2023-10-11 ENCOUNTER — Ambulatory Visit: Admission: RE | Admit: 2023-10-11 | Source: Home / Self Care | Admitting: Gastroenterology

## 2023-10-11 SURGERY — EGD (ESOPHAGOGASTRODUODENOSCOPY)
Anesthesia: General

## 2023-10-11 MED ORDER — PROPOFOL 10 MG/ML IV BOLUS
INTRAVENOUS | Status: AC
  Filled 2023-10-11: qty 20

## 2023-10-11 MED ORDER — LIDOCAINE HCL (PF) 2 % IJ SOLN
INTRAMUSCULAR | Status: AC
  Filled 2023-10-11: qty 5

## 2023-10-22 ENCOUNTER — Other Ambulatory Visit: Payer: Self-pay

## 2023-10-22 DIAGNOSIS — R972 Elevated prostate specific antigen [PSA]: Secondary | ICD-10-CM

## 2023-10-29 ENCOUNTER — Other Ambulatory Visit

## 2023-10-29 DIAGNOSIS — R972 Elevated prostate specific antigen [PSA]: Secondary | ICD-10-CM

## 2023-10-30 LAB — PSA: Prostate Specific Ag, Serum: 14.3 ng/mL — ABNORMAL HIGH (ref 0.0–4.0)

## 2023-11-01 ENCOUNTER — Ambulatory Visit
Admission: RE | Admit: 2023-11-01 | Discharge: 2023-11-01 | Disposition: A | Discharge status: Home / Self Care | Attending: Gastroenterology | Admitting: Gastroenterology

## 2023-11-01 ENCOUNTER — Encounter: Payer: Self-pay | Admitting: Gastroenterology

## 2023-11-01 ENCOUNTER — Ambulatory Visit: Admitting: Urology

## 2023-11-01 ENCOUNTER — Encounter
Admission: RE | Disposition: A | Discharge status: Home / Self Care | Payer: Self-pay | Source: Home / Self Care | Attending: Gastroenterology

## 2023-11-01 ENCOUNTER — Ambulatory Visit: Admitting: Certified Registered"

## 2023-11-01 DIAGNOSIS — K449 Diaphragmatic hernia without obstruction or gangrene: Secondary | ICD-10-CM | POA: Diagnosis not present

## 2023-11-01 DIAGNOSIS — Z7984 Long term (current) use of oral hypoglycemic drugs: Secondary | ICD-10-CM | POA: Insufficient documentation

## 2023-11-01 DIAGNOSIS — I10 Essential (primary) hypertension: Secondary | ICD-10-CM | POA: Diagnosis not present

## 2023-11-01 DIAGNOSIS — K219 Gastro-esophageal reflux disease without esophagitis: Secondary | ICD-10-CM | POA: Insufficient documentation

## 2023-11-01 DIAGNOSIS — E119 Type 2 diabetes mellitus without complications: Secondary | ICD-10-CM | POA: Diagnosis not present

## 2023-11-01 DIAGNOSIS — K2289 Other specified disease of esophagus: Secondary | ICD-10-CM | POA: Insufficient documentation

## 2023-11-01 HISTORY — PX: ESOPHAGOGASTRODUODENOSCOPY: SHX5428

## 2023-11-01 SURGERY — EGD (ESOPHAGOGASTRODUODENOSCOPY)
Anesthesia: General

## 2023-11-01 MED ORDER — PROPOFOL 10 MG/ML IV BOLUS
INTRAVENOUS | Status: DC | PRN
  Administered 2023-11-01: 100 mg via INTRAVENOUS

## 2023-11-01 MED ORDER — SODIUM CHLORIDE 0.9 % IV SOLN: INTRAVENOUS | Status: DC

## 2023-11-01 MED ORDER — LIDOCAINE HCL (CARDIAC) PF 100 MG/5ML IV SOSY
PREFILLED_SYRINGE | INTRAVENOUS | Status: DC | PRN
  Administered 2023-11-01: 100 mg via INTRAVENOUS

## 2023-11-01 NOTE — Anesthesia Preprocedure Evaluation (Addendum)
 Anesthesia Evaluation  Patient identified by MRN, date of birth, ID band Patient awake    Reviewed: Allergy & Precautions, NPO status , Patient's Chart, lab work & pertinent test results  History of Anesthesia Complications Negative for: history of anesthetic complications  Airway Mallampati: III  TM Distance: <3 FB Neck ROM: full    Dental  (+) Chipped, Upper Dentures, Poor Dentition, Missing   Pulmonary neg pulmonary ROS, neg shortness of breath   Pulmonary exam normal        Cardiovascular Exercise Tolerance: Good hypertension, Normal cardiovascular exam     Neuro/Psych negative neurological ROS  negative psych ROS   GI/Hepatic Neg liver ROS,GERD  Controlled,,  Endo/Other  diabetes, Type 2    Renal/GU Renal disease  negative genitourinary   Musculoskeletal   Abdominal   Peds  Hematology negative hematology ROS (+)   Anesthesia Other Findings Past Medical History: No date: Anemia No date: Arthritis No date: Cancer (HCC) 03/26/2013: Diabetes (HCC) 03/26/2013: GERD (gastroesophageal reflux disease) No date: Gout No date: History of hematuria 03/26/2013: HTN (hypertension) No date: Renal insufficiency  Past Surgical History: No date: APPENDECTOMY 11/18/2018: COLONOSCOPY WITH PROPOFOL ; N/A     Comment:  Procedure: COLONOSCOPY WITH PROPOFOL ;  Surgeon: Unk Corinn Skiff, MD;  Location: [facility] ENDOSCOPY;  Service:               Gastroenterology;  Laterality: N/A; 08/29/2016: ROBOTIC ASSITED PARTIAL NEPHRECTOMY; Left     Comment:  Procedure: ROBOTIC ASSITED PARTIAL NEPHRECTOMY;                Surgeon: Penne Knee, MD;  Location: [facility] ORS;                Service: Urology;  Laterality: Left; No date: UPPER GI ENDOSCOPY  BMI    Body Mass Index: 20.67 kg/m      Reproductive/Obstetrics negative OB ROS                              Anesthesia Physical Anesthesia  Plan  ASA: 2  Anesthesia Plan: General   Post-op Pain Management:    Induction: Intravenous  PONV Risk Score and Plan: Propofol  infusion and TIVA  Airway Management Planned: Natural Airway and Nasal Cannula  Additional Equipment:   Intra-op Plan:   Post-operative Plan:   Informed Consent: I have reviewed the patients History and Physical, chart, labs and discussed the procedure including the risks, benefits and alternatives for the proposed anesthesia with the patient or authorized representative who has indicated his/her understanding and acceptance.     Dental Advisory Given  Plan Discussed with: Anesthesiologist, CRNA and Surgeon  Anesthesia Plan Comments: (Patient consented for risks of anesthesia including but not limited to:  - adverse reactions to medications - risk of airway placement if required - damage to eyes, teeth, lips or other oral mucosa - nerve damage due to positioning  - sore throat or hoarseness - Damage to heart, brain, nerves, lungs, other parts of body or loss of life  Patient voiced understanding and assent.)         Anesthesia Quick Evaluation

## 2023-11-01 NOTE — Anesthesia Postprocedure Evaluation (Signed)
 Anesthesia Post Note  Patient: Ricky Jarvis  Procedure(s) Performed: EGD (ESOPHAGOGASTRODUODENOSCOPY)  Patient location during evaluation: Endoscopy Anesthesia Type: General Level of consciousness: awake and alert Pain management: pain level controlled Vital Signs Assessment: post-procedure vital signs reviewed and stable Respiratory status: spontaneous breathing, nonlabored ventilation and respiratory function stable Cardiovascular status: blood pressure returned to baseline and stable Postop Assessment: no apparent nausea or vomiting Anesthetic complications: no   No notable events documented.   Last Vitals:  Vitals:   11/01/23 1121 11/01/23 1137  BP: 113/71 (!) 137/90  Pulse: 66   Resp: 19   Temp:    SpO2: 100% 100%    Last Pain:  Vitals:   11/01/23 1121  TempSrc:   PainSc: 0-No pain                 Fairy POUR Nylan Nakatani

## 2023-11-01 NOTE — Transfer of Care (Signed)
 Immediate Anesthesia Transfer of Care Note  Patient: Ricky Jarvis  Procedure(s) Performed: EGD (ESOPHAGOGASTRODUODENOSCOPY)  Patient Location: Endoscopy Unit  Anesthesia Type:General  Level of Consciousness: drowsy  Airway & Oxygen Therapy: Patient Spontanous Breathing  Post-op Assessment: Report given to RN and Post -op Vital signs reviewed and stable  Post vital signs: Reviewed and stable  Last Vitals:  Vitals Value Taken Time  BP 100/61 11/01/23 11:12  Temp 35.9 1112  Pulse 72 11/01/23 11:12  Resp 16 11/01/23 11:12  SpO2 98 % 11/01/23 11:12  Vitals shown include unfiled device data.  Last Pain:  Vitals:   11/01/23 1025  TempSrc: Temporal  PainSc: 0-No pain         Complications: No notable events documented.

## 2023-11-01 NOTE — Op Note (Addendum)
 [facility] Gastroenterology Patient Name: Ricky Jarvis Procedure Date: 11/01/2023 10:59 AM MRN: [id number] Account #: [id number] Date of Birth: Jun 24, 1950 Admit Type: Outpatient Age: 73 Room: [facility] ENDO ROOM 2 Gender: Male Note Status: Supervisor Override Instrument Name: [facility] GI Scope [id number] Procedure:             Upper GI endoscopy Indications:           Iron  deficiency anemia, Follow-up of gastro-esophageal                         reflux disease Providers:             Ruel Kung MD, MD Referring MD:          Sheralyn Flock, MD (Referring MD) Medicines:             Monitored Anesthesia Care Complications:         No immediate complications. Procedure:             Pre-Anesthesia Assessment:                        - Prior to the procedure, a History and Physical was                         performed, and patient medications, allergies and                         sensitivities were reviewed. The patient's tolerance                         of previous anesthesia was reviewed.                        - The risks and benefits of the procedure and the                         sedation options and risks were discussed with the                         patient. All questions were answered and informed                         consent was obtained.                        - ASA Grade Assessment: II - A patient with mild                         systemic disease.                        After obtaining informed consent, the endoscope was                         passed under direct vision. Throughout the procedure,                         the patient's blood pressure, pulse, and oxygen  saturations were monitored continuously. The Endoscope                         was introduced through the mouth, and advanced to the                         third part of duodenum. The upper GI endoscopy was                         accomplished with ease. The patient  tolerated the                         procedure well. Findings:      The examined duodenum was normal.      A medium-sized hiatal hernia was present.      The cardia and gastric fundus were normal on retroflexion.      The Z-line was irregular. Biopsies were taken with a cold forceps for       histology. Impression:            - Normal examined duodenum.                        - Medium-sized hiatal hernia.                        - Z-line irregular. Biopsied. Recommendation:        - Await pathology results.                        - Discharge patient to home (with escort).                        - Resume previous diet.                        - Continue present medications.                        - Return to GI office as previously scheduled. Procedure Code(s):     --- Professional ---                        [phone], Esophagogastroduodenoscopy, flexible,                         transoral; with biopsy, single or multiple Diagnosis Code(s):     --- Professional ---                        K44.9, Diaphragmatic hernia without obstruction or                         gangrene                        K22.89, Other specified disease of esophagus                        D50.9, Iron  deficiency anemia, unspecified                        K21.9, Gastro-esophageal reflux disease without  esophagitis CPT copyright 2022 American Medical Association. All rights reserved. The codes documented in this report are preliminary and upon coder review may  be revised to meet current compliance requirements. Ruel Kung, MD Ruel Kung MD, MD 11/01/2023 11:09:03 AM This report has been signed electronically. Number of Addenda: 0 Note Initiated On: 11/01/2023 10:59 AM Estimated Blood Loss:  Estimated blood loss: none.      [facility]

## 2023-11-01 NOTE — H&P (Signed)
 Ruel Kung , MD [address], [address], [place], [place], [address] Phone: [phone] Fax: [phone]  Primary Care Physician:  Britta King, MD   Pre-Procedure History & Physical: HPI:  Ricky Jarvis is a 73 y.o. male is here for an endoscopy    Past Medical History:  Diagnosis Date   Anemia    Arthritis    Cancer (HCC)    Diabetes (HCC) 03/26/2013   GERD (gastroesophageal reflux disease) 03/26/2013   Gout    History of hematuria    HTN (hypertension) 03/26/2013   Renal insufficiency     Past Surgical History:  Procedure Laterality Date   APPENDECTOMY     COLONOSCOPY WITH PROPOFOL  N/A 11/18/2018   Procedure: COLONOSCOPY WITH PROPOFOL ;  Surgeon: Unk Corinn Skiff, MD;  Location: [facility] ENDOSCOPY;  Service: Gastroenterology;  Laterality: N/A;   ROBOTIC ASSITED PARTIAL NEPHRECTOMY Left 08/29/2016   Procedure: ROBOTIC ASSITED PARTIAL NEPHRECTOMY;  Surgeon: Penne Knee, MD;  Location: [facility] ORS;  Service: Urology;  Laterality: Left;   UPPER GI ENDOSCOPY      Prior to Admission medications   Medication Sig Start Date End Date Taking? Authorizing Provider  acetaminophen  (TYLENOL ) 500 MG tablet Take 500 mg by mouth every 6 (six) hours as needed.    [provider]  ALLERGY RELIEF 10 MG tablet Take 10 mg by mouth daily. 11/03/19   [provider]  amLODipine  (NORVASC ) 5 MG tablet Take 1 tablet (5 mg total) by mouth daily. 07/22/21   Britta King, MD  amoxicillin -clavulanate (AUGMENTIN ) 875-125 MG tablet Take 1 tablet by mouth every 12 (twelve) hours. Patient not taking: Reported on 09/12/2023 05/27/23   Brimage, Vondra, DO  aspirin EC 81 MG tablet Take 81 mg by mouth daily. Swallow whole.    [provider]  benazepril  (LOTENSIN ) 20 MG tablet TAKE 1 TABLET BY MOUTH DAILY AT BEDTIME 01/10/21   Masoud, Javed, MD  cetirizine (ZYRTEC) 10 MG tablet Take 10 mg by mouth daily. 04/20/22   [provider]  cholecalciferol (VITAMIN D) 1000 units  tablet Take 1,000 Units by mouth daily.    [provider]  FLUoxetine (PROZAC) 10 MG capsule Take 10 mg by mouth daily. 01/13/20   [provider]  latanoprost (XALATAN) 0.005 % ophthalmic solution SMARTSIG:In Eye(s) 10/06/22   [provider]  meloxicam (MOBIC) 15 MG tablet Take 1 tablet every day by oral route with meals. 11/27/16   [provider]  omeprazole (PRILOSEC) 20 MG capsule Take 20 mg by mouth.    [provider]  rosuvastatin  (CRESTOR ) 20 MG tablet Take 1 tablet (20 mg total) by mouth daily. 01/11/22 09/12/23  Britta King, MD  sitaGLIPtin-metformin (JANUMET ) 50-500 MG tablet Take 1 tablet by mouth 2 (two) times daily. 01/21/21   Britta King, MD  vitamin B-12 (CYANOCOBALAMIN ) 500 MCG tablet Take 500 mcg by mouth daily.    [provider]    Allergies as of 10/31/2023 - Review Complete 09/12/2023  Allergen Reaction Noted   Pork-derived products Anaphylaxis 05/24/2017   Azithromycin  03/26/2013   Ibuprofen  05/16/2019    Family History  Problem Relation Age of Onset   Kidney cancer Mother    Prostate cancer Neg Hx    Bladder Cancer Neg Hx     Social History   Socioeconomic History   Marital status: Married    Spouse name: Not on file   Number of children: Not on file   Years of education: Not on file  Highest education level: Not on file  Occupational History   Not on file  Tobacco Use   Smoking status: Never   Smokeless tobacco: Never  Vaping Use   Vaping status: Never Used  Substance and Sexual Activity   Alcohol use: No   Drug use: No   Sexual activity: Not Currently  Other Topics Concern   Not on file  Social History Narrative   Not on file   Social Drivers of Health   Financial Resource Strain: Low Risk  (09/24/2023)   Received from [facility]   Overall Financial Resource Strain (CARDIA)    Difficulty of Paying Living Expenses: Not hard at all  Food Insecurity: Unknown  (09/24/2023)   Received from [facility]   Hunger Vital Sign    Within the past 12 months, you worried that your food would run out before you got the money to buy more.: Never true    Within the past 12 months, the food you bought just didn't last and you didn't have money to get more.: Patient declined  Transportation Needs: No Transportation Needs (09/24/2023)   Received from [facility] - Transportation    In the past 12 months, has lack of transportation kept you from medical appointments or from getting medications?: No    Lack of Transportation (Non-Medical): No  Physical Activity: Insufficiently Active (01/20/2021)   Exercise Vital Sign    Days of Exercise per Week: 3 days    Minutes of Exercise per Session: 30 min  Stress: No Stress Concern Present (01/20/2021)   [employer] of Occupational Health - Occupational Stress Questionnaire    Feeling of Stress : Not at all  Social Connections: Moderately Integrated (01/20/2021)   Social Connection and Isolation Panel    Frequency of Communication with Friends and Family: More than three times a week    Frequency of Social Gatherings with Friends and Family: More than three times a week    Attends Religious Services: More than 4 times per year    Active Member of [employer] or Organizations: No    Attends Banker Meetings: Never    Marital Status: Married  Catering manager Violence: Not At Risk (01/20/2021)   Humiliation, Afraid, Rape, and Kick questionnaire    Fear of Current or Ex-Partner: No    Emotionally Abused: No    Physically Abused: No    Sexually Abused: No    Review of Systems: See HPI, otherwise negative ROS  Physical Exam: There were no vitals taken for this visit. General:   Alert,  pleasant and cooperative in NAD Head:  Normocephalic and atraumatic. Neck:  Supple; no masses or thyromegaly. Lungs:  Clear throughout to auscultation, normal respiratory  effort.    Heart:  +S1, +S2, Regular rate and rhythm, No edema. Abdomen:  Soft, nontender and nondistended. Normal bowel sounds, without guarding, and without rebound.   Neurologic:  Alert and  oriented x4;  grossly normal neurologically.  Impression/Plan: Ricky Jarvis is here for an endoscopy  to be performed for  evaluation of GERD.     Risks, benefits, limitations, and alternatives regarding endoscopy have been reviewed with the patient.  Questions have been answered.  All parties agreeable.   Ruel Kung, MD  11/01/2023, 10:15 AM

## 2023-11-02 LAB — SURGICAL PATHOLOGY

## 2023-11-05 NOTE — Progress Notes (Unsigned)
 11/06/2023 11:49 AM   Karlene Dryer 03/27/1950 [id number]  Referring provider: Britta King, MD [address] [place],  [place] [address]  Urological history: 1.  Bilateral renal cell carcinoma - left robotic partial nephrectomy (2018) - right robotic nephrectomy (2019)  - > 5 years with no recurrence   2. Elevated PSA - PSA (14.3)  - PSAD 2.7 - Prostate biopsy (2020) negative - Prostate MRI (2021) PI-RADS 2   3. BPH with LU TS - prostate volume (prostate MRI 2021) 53 cc  No chief complaint on file.  HPI: Neils Siracusa is a 73 y.o. man who presents today for yearly follow up.    Previous records reviewed.  I PSS ***  He reports sensation of incomplete bladder emptying, urinary frequency, urinary intermittency, urinary urgency, a weak urinary stream, having to strain to void, nocturia x ***, leaking before being able to reach the restroom, leaking with coughing, leaking without awareness, and post void dribbling.     He is wearing *** pads//depends  daily.    Patient denies any modifying or aggravating factors.  Patient denies any recent UTI's, gross hematuria, dysuria or suprapubic/flank pain.  Patient denies any fevers, chills, nausea or vomiting.  ***  He has a family history of PCa, colon cancer, ovarian cancer and/or breast cancer with ***.   He does not have a family history of PCa, colon cancer, ovarian cancer, and/or breast cancer .***     UA***  PVR***  PSA (10/2023) 14.3  Diuretics:  ***  Fluid consumptiom: ***  PMH: Past Medical History:  Diagnosis Date   Anemia    Arthritis    Cancer (HCC)    Diabetes (HCC) 03/26/2013   GERD (gastroesophageal reflux disease) 03/26/2013   Gout    History of hematuria    HTN (hypertension) 03/26/2013   Renal insufficiency     Surgical History: Past Surgical History:  Procedure Laterality Date   APPENDECTOMY     COLONOSCOPY WITH PROPOFOL  N/A 11/18/2018   Procedure: COLONOSCOPY WITH PROPOFOL ;  Surgeon: Unk Corinn Skiff, MD;  Location: [facility] ENDOSCOPY;  Service: Gastroenterology;  Laterality: N/A;   ESOPHAGOGASTRODUODENOSCOPY N/A 11/01/2023   Procedure: EGD (ESOPHAGOGASTRODUODENOSCOPY);  Surgeon: Therisa Bi, MD;  Location: [facility] ENDOSCOPY;  Service: Gastroenterology;  Laterality: N/A;  DM   ROBOTIC ASSITED PARTIAL NEPHRECTOMY Left 08/29/2016   Procedure: ROBOTIC ASSITED PARTIAL NEPHRECTOMY;  Surgeon: Penne Knee, MD;  Location: [facility] ORS;  Service: Urology;  Laterality: Left;   UPPER GI ENDOSCOPY      Home Medications:  Allergies as of 11/06/2023       Reactions   Pork-derived Products Anaphylaxis   Azithromycin    Other reaction(s): Other (See Comments)  Pt states that he has hiccups when he takes this medication   Ibuprofen         Medication List        Accurate as of November 05, 2023 11:49 AM. If you have any questions, ask your nurse or doctor.          acetaminophen  500 MG tablet Commonly known as: TYLENOL  Take 500 mg by mouth every 6 (six) hours as needed.   Allergy Relief 10 MG tablet Generic drug: loratadine Take 10 mg by mouth daily.   amLODipine  5 MG tablet Commonly known as: NORVASC  Take 1 tablet (5 mg total) by mouth daily.   amoxicillin -clavulanate 875-125 MG tablet Commonly known as: AUGMENTIN  Take 1 tablet by mouth every 12 (twelve) hours.   aspirin EC 81 MG  tablet Take 81 mg by mouth daily. Swallow whole.   benazepril  20 MG tablet Commonly known as: LOTENSIN  TAKE 1 TABLET BY MOUTH DAILY AT BEDTIME   cetirizine 10 MG tablet Commonly known as: ZYRTEC Take 10 mg by mouth daily.   cholecalciferol 1000 units tablet Commonly known as: VITAMIN D Take 1,000 Units by mouth daily.   cyanocobalamin  500 MCG tablet Commonly known as: VITAMIN B12 Take 500 mcg by mouth daily.   FLUoxetine 10 MG capsule Commonly known as: PROZAC Take 10 mg by mouth daily.   Janumet  50-500 MG tablet Generic drug: sitaGLIPtin-metformin Take 1 tablet by mouth 2  (two) times daily.   latanoprost 0.005 % ophthalmic solution Commonly known as: XALATAN SMARTSIG:In Eye(s)   meloxicam 15 MG tablet Commonly known as: MOBIC Take 1 tablet every day by oral route with meals.   omeprazole 20 MG capsule Commonly known as: PRILOSEC Take 20 mg by mouth.   rosuvastatin  20 MG tablet Commonly known as: CRESTOR  Take 1 tablet (20 mg total) by mouth daily.        Allergies:  Allergies  Allergen Reactions   Pork-Derived Products Anaphylaxis   Azithromycin     Other reaction(s): Other (See Comments)  Pt states that he has hiccups when he takes this medication   Ibuprofen     Family History: Family History  Problem Relation Age of Onset   Kidney cancer Mother    Prostate cancer Neg Hx    Bladder Cancer Neg Hx     Social History:  reports that he has never smoked. He has never used smokeless tobacco. He reports that he does not drink alcohol and does not use drugs.  ROS: Pertinent ROS in HPI  Physical Exam: There were no vitals taken for this visit.  Constitutional:  Well nourished. Alert and oriented, No acute distress. HEENT: [place] AT, moist mucus membranes.  Trachea midline, no masses. Cardiovascular: No clubbing, cyanosis, or edema. Respiratory: Normal respiratory effort, no increased work of breathing. GI: Abdomen is soft, non tender, non distended, no abdominal masses. Liver and spleen not palpable.  No hernias appreciated.  Stool sample for occult testing is not indicated.   GU: No CVA tenderness.  No bladder fullness or masses.  Patient with circumcised/uncircumcised phallus. ***Foreskin easily retracted***  Urethral meatus is patent.  No penile discharge. No penile lesions or rashes. Scrotum without lesions, cysts, rashes and/or edema.  Testicles are located scrotally bilaterally. No masses are appreciated in the testicles. Left and right epididymis are normal. Rectal: Patient with  normal sphincter tone. Anus and perineum without  scarring or rashes. No rectal masses are appreciated. Prostate is approximately *** grams, *** nodules are appreciated. Seminal vesicles are normal. Skin: No rashes, bruises or suspicious lesions. Lymph: No cervical or inguinal adenopathy. Neurologic: Grossly intact, no focal deficits, moving all 4 extremities. Psychiatric: Normal mood and affect.  Laboratory Data: See EPIC and HPI I have reviewed the labs.   Pertinent Imaging: ***  Assessment & Plan:  ***  1. Elevated PSA - Discussed that his PSA has had a significant increase which will warrant further investigation as this is concerning for possible prostate cancer - UA *** - PVR *** - Explained that we obtain a prostate MRI in preparation for a likely fusion biopsy of the prostate, he will return to discuss those results  2. BPH with LU TS - prostate volume 53 on prostate MRI in 2021  3.  History of bilateral renal cell carcinoma - Greater  than 5 years without recurrence - No more surveillance warranted  No follow-ups on file.  These notes generated with voice recognition software. I apologize for typographical errors.  CLOTILDA HELON RIGGERS  [facility] [address]  [address] [place], [place] [address] [phone]

## 2023-11-06 ENCOUNTER — Encounter: Payer: Self-pay | Admitting: Urology

## 2023-11-06 ENCOUNTER — Ambulatory Visit (INDEPENDENT_AMBULATORY_CARE_PROVIDER_SITE_OTHER): Admitting: Urology

## 2023-11-06 VITALS — BP 124/77 | HR 79 | Ht 63.0 in | Wt 114.0 lb

## 2023-11-06 DIAGNOSIS — R972 Elevated prostate specific antigen [PSA]: Secondary | ICD-10-CM

## 2023-11-06 DIAGNOSIS — N401 Enlarged prostate with lower urinary tract symptoms: Secondary | ICD-10-CM | POA: Diagnosis not present

## 2023-11-06 DIAGNOSIS — N402 Nodular prostate without lower urinary tract symptoms: Secondary | ICD-10-CM | POA: Diagnosis not present

## 2023-11-06 DIAGNOSIS — R351 Nocturia: Secondary | ICD-10-CM

## 2023-11-06 DIAGNOSIS — Z85528 Personal history of other malignant neoplasm of kidney: Secondary | ICD-10-CM

## 2023-11-06 LAB — MICROSCOPIC EXAMINATION

## 2023-11-06 LAB — URINALYSIS, COMPLETE
Bilirubin, UA: NEGATIVE
Glucose, UA: NEGATIVE
Ketones, UA: NEGATIVE
Leukocytes,UA: NEGATIVE
Nitrite, UA: NEGATIVE
Specific Gravity, UA: 1.015 (ref 1.005–1.030)
Urobilinogen, Ur: 0.2 mg/dL (ref 0.2–1.0)
pH, UA: 6 (ref 5.0–7.5)

## 2023-11-06 LAB — BLADDER SCAN AMB NON-IMAGING

## 2023-11-06 MED ORDER — GEMTESA 75 MG PO TABS: 75.0000 mg | ORAL_TABLET | Freq: Every day | ORAL | Status: DC

## 2023-11-30 DIAGNOSIS — R972 Elevated prostate specific antigen [PSA]: Secondary | ICD-10-CM | POA: Insufficient documentation

## 2023-11-30 NOTE — Progress Notes (Signed)
   12/06/2023 2:42 PM   [employer] [birth year] [id number]  Reason for visit: Follow up elevated PSA   HPI: 73 y.o. male, initial follow up with me today, previously seen by Dr. Penne in Sept 2024, GEORGIA McGowan in Sept 2025 Unfortunately did not complete prostate MRI prior to visit today  Prior HPI: Hx of bilateral RCC  - s/p Left robotic pNx (2018  - s/p Right robotic radical Nx (2019)  Hx of elevated PSA  - PSA 14.3 (Sept 2025)  - PSAD 2.7  - Prostate biopsy (2020) negative  - Prostate MRI (2021) PI-RADS 2     Physical Exam: BP 113/65   Pulse 70   Ht 5' 3 (1.6 m)   Wt 115 lb 6.4 oz (52.3 kg)   SpO2 97%   BMI 20.44 kg/m    Constitutional:  Alert and oriented, No acute distress.  Laboratory Data:          Component Ref Range & Units (hover) 1 mo ago (10/29/23) 1 yr ago (07/18/22) 2 yr ago (06/07/21) 3 yr ago (02/05/20) 4 yr ago (08/22/19) 4 yr ago (06/30/19) 7 yr ago (06/13/16)  Prostate Specific Ag, Serum 14.3 High  10.5 High  CM 12.4 High  CM 10.2 High  CM 10.6 High  CM 11.3 High  CM 6.4 High      Pertinent Imaging: N/A    Assessment & Plan:    Elevated PSA Assessment & Plan: PSA 14.3 (Sept 2025)  Baseline 10-12 (since 2021)  Negative prostate biopsy and MRI in 2021 ~54g gland, PSAD 0.27  - Prior prostate MRI order active.  Will help patient with scheduling information - He may return to see me once MRI completed and I will review next steps          Penne JONELLE Skye, MD  [facility] [address] [place], [place] [address] [phone]

## 2023-11-30 NOTE — Assessment & Plan Note (Addendum)
 PSA 14.3 (Sept 2025)  Baseline 10-12 (since 2021)  Negative prostate biopsy and MRI in 2021 ~54g gland, PSAD 0.27  - Prior prostate MRI order active.  Will help patient with scheduling information - He may return to see me once MRI completed and I will review next steps

## 2023-12-06 ENCOUNTER — Ambulatory Visit (INDEPENDENT_AMBULATORY_CARE_PROVIDER_SITE_OTHER): Admitting: Urology

## 2023-12-06 ENCOUNTER — Encounter: Payer: Self-pay | Admitting: Urology

## 2023-12-06 VITALS — BP 113/65 | HR 70 | Ht 63.0 in | Wt 115.4 lb

## 2023-12-06 DIAGNOSIS — R972 Elevated prostate specific antigen [PSA]: Secondary | ICD-10-CM

## 2023-12-06 NOTE — Patient Instructions (Addendum)
 Please contact Central Scheduling to set up your prostate MRI at [phone].  Prostate MRI Prep:  1- No ejaculation 48 hours prior to exam  2- No caffeine or carbonated beverages on day of the exam  3- Eat light diet evening prior and day of exam  4- Avoid eating 4 hours prior to exam  5- Fleets enema needs to be done 4 hours prior to exam -See below. Can be purchased at the drug store.

## 2023-12-17 ENCOUNTER — Ambulatory Visit
Admission: RE | Admit: 2023-12-17 | Discharge: 2023-12-17 | Disposition: A | Discharge status: Home / Self Care | Source: Ambulatory Visit | Attending: Urology | Admitting: Urology

## 2023-12-17 ENCOUNTER — Other Ambulatory Visit: Payer: Self-pay | Admitting: Physician Assistant

## 2023-12-17 DIAGNOSIS — R972 Elevated prostate specific antigen [PSA]: Secondary | ICD-10-CM

## 2023-12-17 DIAGNOSIS — N402 Nodular prostate without lower urinary tract symptoms: Secondary | ICD-10-CM | POA: Insufficient documentation

## 2023-12-17 MED ORDER — DIAZEPAM 2 MG PO TABS
ORAL_TABLET | ORAL | 0 refills | Status: AC
Start: 2023-12-17 — End: ?

## 2023-12-17 MED ORDER — GADOBUTROL 1 MMOL/ML IV SOLN
6.0000 mL | Freq: Once | INTRAVENOUS | Status: AC | PRN
  Administered 2023-12-17: 6 mL via INTRAVENOUS

## 2023-12-17 NOTE — Progress Notes (Signed)
 Pre-MRI valium sent in per pt request. Instructed to have a driver/not operate heavy machinery on this medication.

## 2024-01-02 NOTE — Progress Notes (Signed)
   01/07/2024 2:08 PM   Ricky Jarvis 01-14-1951 [id number]  Reason for visit: Follow up elevated PSA   HPI: 73 y.o. male, initial follow up with me today, previously seen by Dr. Penne in Sept 2024, GEORGIA McGowan in Sept 2025  MRI Prostate (12/19/23) - no PIRADS >3 lesions, ~76g gland  Prior HPI: Hx of bilateral RCC  - s/p Left robotic pNx (Dr. Penne, 2018) - pT1a RCC, -sm  - s/p Right robotic radical Nx [facility], 2019) - pT3a ccRCC -sm  Hx of elevated PSA  - PSA 14.3 (Sept 2025)  - PSAD 2.7  - Prostate biopsy (2020) negative  - Prostate MRI (2021) PI-RADS 2     Physical Exam: BP (!) 148/78   Pulse 85   Ht 5' 2 (1.575 m)   Wt 113 lb 6.4 oz (51.4 kg)   BMI 20.74 kg/m    Constitutional:  Alert and oriented, No acute distress.  Laboratory Data:          Component Ref Range & Units (hover) 1 mo ago (10/29/23) 1 yr ago (07/18/22) 2 yr ago (06/07/21) 3 yr ago (02/05/20) 4 yr ago (08/22/19) 4 yr ago (06/30/19) 7 yr ago (06/13/16)  Prostate Specific Ag, Serum 14.3 High  10.5 High  CM 12.4 High  CM 10.2 High  CM 10.6 High  CM 11.3 High  CM 6.4 High      Pertinent Imaging: MRI Prostate (12/19/23) -  IMPRESSION: 1. No high grade carcinoma identified within the peripheral zone. 2. Enlarged nodular transitional zone most consistent with benign prostatic hyperplasia, PI-RADS 2. 3. Estimated prostate volume of 76 cc.    Assessment & Plan:    Elevated PSA Assessment & Plan: PSA 14.3 (Sept 2025)  Baseline 10-12 (since 2021)  Negative prostate biopsy and MRI in 2021 ~54g gland, PSAD 0.27  MRI Prostate (12/19/23) - no PIRADS >3 lesions, ~76g gland  Reviewed his recent MRI results-reassuringly this was negative, similar to MRI in 2021.  Elevated PSA likely secondary to benign prostate enlargement.  He has had a thorough and robust negative elevated PSA workup.  I would maintain a high threshold to repeat in the future.  I would also strongly consider discontinuation of future  routine PSA testing.   - In generally good health, plan to continue annual PSA screening until age 57- shared decision making, plan to discontinue thereafter     Benign prostatic hyperplasia with lower urinary tract symptoms, symptom details unspecified Assessment & Plan: Mild baseline LUTS, not overly bothersome  Today we reviewed the physiology and common causes of male lower urinary tract symptoms (LUTS). Discussed potential etiologies including infectious, inflammatory, bladder-related, benign prostatic hyperplasia (BPH), and musculoskeletal/pelvic floor contributions. Reviewed the standard diagnostic workup (urinalysis, PVR, uroflow, prostate assessment, possible cystoscopy or imaging) and the spectrum of initial management strategies ranging from behavioral and lifestyle measures to pharmacologic therapy, with procedural options if indicated. All questions were addressed and the patient expressed understanding of the evaluation and treatment pathway.  - expectant management for now. Discussed empiric Flomax as next reasonable option. He will let me know         Penne JONELLE Skye, MD  [facility] [address] [place], [place] [address] [phone]

## 2024-01-02 NOTE — Assessment & Plan Note (Addendum)
 PSA 14.3 (Sept 2025)  Baseline 10-12 (since 2021)  Negative prostate biopsy and MRI in 2021 ~54g gland, PSAD 0.27  MRI Prostate (12/19/23) - no PIRADS >3 lesions, ~76g gland  Reviewed his recent MRI results-reassuringly this was negative, similar to MRI in 2021.  Elevated PSA likely secondary to benign prostate enlargement.  He has had a thorough and robust negative elevated PSA workup.  I would maintain a high threshold to repeat in the future.  I would also strongly consider discontinuation of future routine PSA testing.   - In generally good health, plan to continue annual PSA screening until age 30- shared decision making, plan to discontinue thereafter

## 2024-01-07 ENCOUNTER — Ambulatory Visit: Admitting: Urology

## 2024-01-07 ENCOUNTER — Encounter: Payer: Self-pay | Admitting: Urology

## 2024-01-07 VITALS — BP 148/78 | HR 85 | Ht 62.0 in | Wt 113.4 lb

## 2024-01-07 DIAGNOSIS — N400 Enlarged prostate without lower urinary tract symptoms: Secondary | ICD-10-CM | POA: Insufficient documentation

## 2024-01-07 DIAGNOSIS — R972 Elevated prostate specific antigen [PSA]: Secondary | ICD-10-CM

## 2024-01-07 DIAGNOSIS — N401 Enlarged prostate with lower urinary tract symptoms: Secondary | ICD-10-CM

## 2024-01-07 NOTE — Assessment & Plan Note (Signed)
 Mild baseline LUTS, not overly bothersome  Today we reviewed the physiology and common causes of male lower urinary tract symptoms (LUTS). Discussed potential etiologies including infectious, inflammatory, bladder-related, benign prostatic hyperplasia (BPH), and musculoskeletal/pelvic floor contributions. Reviewed the standard diagnostic workup (urinalysis, PVR, uroflow, prostate assessment, possible cystoscopy or imaging) and the spectrum of initial management strategies ranging from behavioral and lifestyle measures to pharmacologic therapy, with procedural options if indicated. All questions were addressed and the patient expressed understanding of the evaluation and treatment pathway.  - expectant management for now. Discussed empiric Flomax as next reasonable option. He will let me know

## 2024-09-10 ENCOUNTER — Other Ambulatory Visit

## 2024-09-10 ENCOUNTER — Ambulatory Visit: Admitting: Oncology

## 2024-12-31 ENCOUNTER — Other Ambulatory Visit

## 2025-01-07 ENCOUNTER — Ambulatory Visit: Admitting: Urology
